# Patient Record
Sex: Female | Born: 1985 | Race: White | Hispanic: No | Marital: Married | Smoking: Never smoker
Health system: Southern US, Community
[De-identification: ages and names within clinical notes are randomized; demographics above are authoritative.]

## PROBLEM LIST (undated history)

## (undated) DIAGNOSIS — Z789 Other specified health status: Secondary | ICD-10-CM

## (undated) DIAGNOSIS — T8859XA Other complications of anesthesia, initial encounter: Secondary | ICD-10-CM

## (undated) DIAGNOSIS — L509 Urticaria, unspecified: Secondary | ICD-10-CM

## (undated) DIAGNOSIS — T4145XA Adverse effect of unspecified anesthetic, initial encounter: Secondary | ICD-10-CM

## (undated) HISTORY — DX: Urticaria, unspecified: L50.9

## (undated) HISTORY — PX: OTHER SURGICAL HISTORY: SHX169

---

## 2017-02-17 NOTE — L&D Delivery Note (Signed)
Delivery Note   09/25/2017  Date of delivery: 09/26/17  Lynden Angallie R Gilberti, 32 y.o., @ 6143w6d,  G1P0, who was admitted for PPROM, clear fluids. I was called to the room when she progressed 0 station in the second stage of labor. Pt felt urge to push and was complete. During pushing there were deep variable noted during pushing to the lowest of 70-80 and return to baseline of 150after pushing stopped. When pt has pushed for 2 hours I called Dr Normand Sloopillard and updated her, I was advised to to start an amnioinfusion. Started with a 300ml bolus and continue with 13650ml/hr, variable resolved. Dr Normand Sloopillard was in route to the hospital at this point.  She pushed for another 1hour with Dr Normand Sloopillard present. During delivery after the head and a compound posterior arm was noted, I took 15sec and attempted to delivery the posterior arm, but was unsuccessful with gentle traction, Dr Normand Sloopillard stepped in for me and finished the delivery of the infant.  She delivered a viable infant, cephalic compound presentation with the posterior arm delivered first after the anterior shoulder was seen and restituted to the ROT position over an intact perineum.  A nuchal cord   was not identified, but x 2body cord was. The baby was placed on maternal abdomen while initial step of NRP were perfmored (Dry, Stimulated, and warmed). Hat placed on baby for thermoregulation. Delayed cord clamping was performed for 15 seconds due to the need for NICU evaluation the infant was brought to the radiant warmer.  Cord double clamped and cut.  Cord cut by Father. Apgar scores were 5 and 6 and 9. The placenta which was sent to pathology delivered spontaneously, and active ,managemnt of the third stage was performed with pitocin running, the placenta delivered shultz, with a 3 vessel cord.  Inspection revealed 2nd degree. An examination of the vaginal vault and cervix was free from lacerations. The uterus was firm, bleeding stable.  The repair was done under local.    Placenta and umbilical artery blood gas were sent.  There were no complications during the procedure.  Mom in bed with infant on radiant warmer with dad by her side. Left in stable condition.  Maternal Info: Anesthesia: Epidural Episiotomy: no Lacerations:  2nd degree repaired with 3-0 vucryl Suture Repair: 3-0 vicryl Est. Blood Loss (mL):  200  Newborn Info: Baby Sex: female Circumcision: No Babies Name: Alvino Chapelllen APGAR (1 MIN): 5   APGAR (5 MINS): 6   APGAR (10 MINS): 9     Mom to postpartum.  Baby to Couplet care / Skin to Skin.  Dr Normand Sloopillard present for the delivery.   Jaspreet Hollings, CNM, NP-C 09/26/17 1:55 AM

## 2017-02-27 LAB — OB RESULTS CONSOLE HEPATITIS B SURFACE ANTIGEN: Hepatitis B Surface Ag: NEGATIVE

## 2017-02-27 LAB — OB RESULTS CONSOLE ABO/RH: RH Type: POSITIVE

## 2017-02-27 LAB — OB RESULTS CONSOLE RPR: RPR: NONREACTIVE

## 2017-02-27 LAB — OB RESULTS CONSOLE RUBELLA ANTIBODY, IGM: Rubella: IMMUNE

## 2017-02-27 LAB — OB RESULTS CONSOLE GC/CHLAMYDIA
CHLAMYDIA, DNA PROBE: NEGATIVE
GC PROBE AMP, GENITAL: NEGATIVE

## 2017-02-27 LAB — OB RESULTS CONSOLE HIV ANTIBODY (ROUTINE TESTING): HIV: NONREACTIVE

## 2017-09-25 ENCOUNTER — Inpatient Hospital Stay (HOSPITAL_COMMUNITY)
Admission: AD | Admit: 2017-09-25 | Discharge: 2017-09-28 | DRG: 805 | Disposition: A | Discharge status: Home / Self Care | Payer: BLUE CROSS/BLUE SHIELD | Attending: Obstetrics and Gynecology | Admitting: Obstetrics and Gynecology

## 2017-09-25 ENCOUNTER — Inpatient Hospital Stay (HOSPITAL_COMMUNITY): Payer: BLUE CROSS/BLUE SHIELD | Admitting: Anesthesiology

## 2017-09-25 ENCOUNTER — Encounter (HOSPITAL_COMMUNITY): Payer: Self-pay

## 2017-09-25 ENCOUNTER — Inpatient Hospital Stay (HOSPITAL_BASED_OUTPATIENT_CLINIC_OR_DEPARTMENT_OTHER): Payer: BLUE CROSS/BLUE SHIELD

## 2017-09-25 ENCOUNTER — Other Ambulatory Visit: Payer: Self-pay

## 2017-09-25 DIAGNOSIS — O9912 Other diseases of the blood and blood-forming organs and certain disorders involving the immune mechanism complicating childbirth: Secondary | ICD-10-CM | POA: Diagnosis present

## 2017-09-25 DIAGNOSIS — D6959 Other secondary thrombocytopenia: Secondary | ICD-10-CM | POA: Diagnosis present

## 2017-09-25 DIAGNOSIS — O99824 Streptococcus B carrier state complicating childbirth: Secondary | ICD-10-CM | POA: Diagnosis present

## 2017-09-25 DIAGNOSIS — E559 Vitamin D deficiency, unspecified: Secondary | ICD-10-CM | POA: Diagnosis present

## 2017-09-25 DIAGNOSIS — O4290 Premature rupture of membranes, unspecified as to length of time between rupture and onset of labor, unspecified weeks of gestation: Secondary | ICD-10-CM

## 2017-09-25 DIAGNOSIS — Z3A36 36 weeks gestation of pregnancy: Secondary | ICD-10-CM

## 2017-09-25 DIAGNOSIS — O42913 Preterm premature rupture of membranes, unspecified as to length of time between rupture and onset of labor, third trimester: Principal | ICD-10-CM | POA: Diagnosis present

## 2017-09-25 DIAGNOSIS — O326XX0 Maternal care for compound presentation, not applicable or unspecified: Secondary | ICD-10-CM | POA: Diagnosis present

## 2017-09-25 DIAGNOSIS — D6949 Other primary thrombocytopenia: Secondary | ICD-10-CM | POA: Diagnosis present

## 2017-09-25 DIAGNOSIS — O42919 Preterm premature rupture of membranes, unspecified as to length of time between rupture and onset of labor, unspecified trimester: Secondary | ICD-10-CM | POA: Diagnosis present

## 2017-09-25 HISTORY — DX: Adverse effect of unspecified anesthetic, initial encounter: T41.45XA

## 2017-09-25 HISTORY — DX: Other specified health status: Z78.9

## 2017-09-25 HISTORY — DX: Other complications of anesthesia, initial encounter: T88.59XA

## 2017-09-25 LAB — CBC
HEMATOCRIT: 36.9 % (ref 36.0–46.0)
Hemoglobin: 12.2 g/dL (ref 12.0–15.0)
MCH: 31.3 pg (ref 26.0–34.0)
MCHC: 33.1 g/dL (ref 30.0–36.0)
MCV: 94.6 fL (ref 78.0–100.0)
PLATELETS: 121 10*3/uL — AB (ref 150–400)
RBC: 3.9 MIL/uL (ref 3.87–5.11)
RDW: 16.1 % — AB (ref 11.5–15.5)
WBC: 10.1 10*3/uL (ref 4.0–10.5)

## 2017-09-25 LAB — TYPE AND SCREEN
ABO/RH(D): O POS
Antibody Screen: NEGATIVE

## 2017-09-25 LAB — ABO/RH: ABO/RH(D): O POS

## 2017-09-25 LAB — POCT FERN TEST: POCT Fern Test: POSITIVE

## 2017-09-25 LAB — GROUP B STREP BY PCR: Group B strep by PCR: POSITIVE — AB

## 2017-09-25 LAB — RPR: RPR Ser Ql: NONREACTIVE

## 2017-09-25 MED ORDER — ACETAMINOPHEN 325 MG PO TABS: 650.0000 mg | ORAL_TABLET | ORAL | Status: DC | PRN

## 2017-09-25 MED ORDER — LACTATED RINGERS AMNIOINFUSION
INTRAVENOUS | Status: DC
  Administered 2017-09-25: via INTRAUTERINE
  Filled 2017-09-25 (×2): qty 1000

## 2017-09-25 MED ORDER — OXYTOCIN BOLUS FROM INFUSION
500.0000 mL | Freq: Once | INTRAVENOUS | Status: AC
  Administered 2017-09-26: 500 mL via INTRAVENOUS

## 2017-09-25 MED ORDER — PHENYLEPHRINE 40 MCG/ML (10ML) SYRINGE FOR IV PUSH (FOR BLOOD PRESSURE SUPPORT)
80.0000 ug | PREFILLED_SYRINGE | INTRAVENOUS | Status: DC | PRN
  Filled 2017-09-25: qty 5

## 2017-09-25 MED ORDER — EPHEDRINE 5 MG/ML INJ: 10.0000 mg | INTRAVENOUS | Status: DC | PRN

## 2017-09-25 MED ORDER — PENICILLIN G POT IN DEXTROSE 60000 UNIT/ML IV SOLN
3.0000 10*6.[IU] | INTRAVENOUS | Status: DC
  Filled 2017-09-25 (×2): qty 50

## 2017-09-25 MED ORDER — LIDOCAINE HCL (PF) 1 % IJ SOLN
INTRAMUSCULAR | Status: DC | PRN
  Administered 2017-09-25: 5 mL via EPIDURAL

## 2017-09-25 MED ORDER — OXYCODONE-ACETAMINOPHEN 5-325 MG PO TABS: 1.0000 | ORAL_TABLET | ORAL | Status: DC | PRN

## 2017-09-25 MED ORDER — LACTATED RINGERS IV SOLN
INTRAVENOUS | Status: DC
  Administered 2017-09-25 (×2): via INTRAVENOUS

## 2017-09-25 MED ORDER — SODIUM CHLORIDE 0.9 % IV SOLN
5.0000 10*6.[IU] | Freq: Once | INTRAVENOUS | Status: AC
  Administered 2017-09-25: 5 10*6.[IU] via INTRAVENOUS
  Filled 2017-09-25: qty 5

## 2017-09-25 MED ORDER — LACTATED RINGERS IV SOLN
500.0000 mL | Freq: Once | INTRAVENOUS | Status: AC
  Administered 2017-09-25: 500 mL via INTRAVENOUS

## 2017-09-25 MED ORDER — OXYTOCIN 40 UNITS IN LACTATED RINGERS INFUSION - SIMPLE MED
2.5000 [IU]/h | INTRAVENOUS | Status: DC
  Filled 2017-09-25: qty 1000

## 2017-09-25 MED ORDER — EPHEDRINE 5 MG/ML INJ
10.0000 mg | INTRAVENOUS | Status: DC | PRN
  Filled 2017-09-25: qty 2

## 2017-09-25 MED ORDER — SOD CITRATE-CITRIC ACID 500-334 MG/5ML PO SOLN: 30.0000 mL | ORAL | Status: DC | PRN

## 2017-09-25 MED ORDER — LIDOCAINE HCL (PF) 1 % IJ SOLN
30.0000 mL | INTRAMUSCULAR | Status: DC | PRN
  Administered 2017-09-26: 30 mL via SUBCUTANEOUS
  Filled 2017-09-25: qty 30

## 2017-09-25 MED ORDER — OXYCODONE-ACETAMINOPHEN 5-325 MG PO TABS: 2.0000 | ORAL_TABLET | ORAL | Status: DC | PRN

## 2017-09-25 MED ORDER — PHENYLEPHRINE 40 MCG/ML (10ML) SYRINGE FOR IV PUSH (FOR BLOOD PRESSURE SUPPORT)
80.0000 ug | PREFILLED_SYRINGE | INTRAVENOUS | Status: DC | PRN
  Filled 2017-09-25: qty 5
  Filled 2017-09-25: qty 10

## 2017-09-25 MED ORDER — FENTANYL 2.5 MCG/ML BUPIVACAINE 1/10 % EPIDURAL INFUSION (WH - ANES)
14.0000 mL/h | INTRAMUSCULAR | Status: DC | PRN
  Administered 2017-09-25 (×3): 14 mL/h via EPIDURAL
  Filled 2017-09-25 (×3): qty 100

## 2017-09-25 MED ORDER — PHENYLEPHRINE 40 MCG/ML (10ML) SYRINGE FOR IV PUSH (FOR BLOOD PRESSURE SUPPORT): 80.0000 ug | PREFILLED_SYRINGE | INTRAVENOUS | Status: DC | PRN

## 2017-09-25 MED ORDER — TERBUTALINE SULFATE 1 MG/ML IJ SOLN
0.2500 mg | Freq: Once | INTRAMUSCULAR | Status: DC | PRN
  Filled 2017-09-25: qty 1

## 2017-09-25 MED ORDER — LACTATED RINGERS IV SOLN: 500.0000 mL | Freq: Once | INTRAVENOUS | Status: DC

## 2017-09-25 MED ORDER — DIPHENHYDRAMINE HCL 50 MG/ML IJ SOLN: 12.5000 mg | INTRAMUSCULAR | Status: DC | PRN

## 2017-09-25 MED ORDER — ONDANSETRON HCL 4 MG/2ML IJ SOLN: 4.0000 mg | Freq: Four times a day (QID) | INTRAMUSCULAR | Status: DC | PRN

## 2017-09-25 MED ORDER — LACTATED RINGERS IV SOLN: 500.0000 mL | INTRAVENOUS | Status: DC | PRN

## 2017-09-25 MED ORDER — OXYTOCIN 40 UNITS IN LACTATED RINGERS INFUSION - SIMPLE MED
1.0000 m[IU]/min | INTRAVENOUS | Status: DC
  Administered 2017-09-25: 2 m[IU]/min via INTRAVENOUS

## 2017-09-25 MED ORDER — PENICILLIN G 3 MILLION UNITS IVPB - SIMPLE MED
3.0000 10*6.[IU] | INTRAVENOUS | Status: DC
  Administered 2017-09-25 (×3): 3 10*6.[IU] via INTRAVENOUS
  Filled 2017-09-25: qty 3
  Filled 2017-09-25 (×2): qty 100
  Filled 2017-09-25 (×2): qty 3
  Filled 2017-09-25 (×2): qty 100

## 2017-09-25 NOTE — Anesthesia Preprocedure Evaluation (Signed)
Anesthesia Evaluation  Patient identified by MRN, date of birth, ID band Patient awake    Reviewed: Allergy & Precautions, Patient's Chart, lab work & pertinent test results  Airway Mallampati: II       Dental  (+) Teeth Intact   Pulmonary    Pulmonary exam normal        Cardiovascular Normal cardiovascular exam     Neuro/Psych    GI/Hepatic Neg liver ROS,   Endo/Other  negative endocrine ROS  Renal/GU negative Renal ROS     Musculoskeletal   Abdominal   Peds  Hematology   Anesthesia Other Findings   Reproductive/Obstetrics (+) Pregnancy                             Anesthesia Physical Anesthesia Plan  ASA: II  Anesthesia Plan: Epidural   Post-op Pain Management:    Induction:   PONV Risk Score and Plan:   Airway Management Planned: Natural Airway  Additional Equipment:   Intra-op Plan:   Post-operative Plan:   Informed Consent: I have reviewed the patients History and Physical, chart, labs and discussed the procedure including the risks, benefits and alternatives for the proposed anesthesia with the patient or authorized representative who has indicated his/her understanding and acceptance.     Plan Discussed with:   Anesthesia Plan Comments: (Lab Results      Component                Value               Date                      WBC                      10.1                09/25/2017                HGB                      12.2                09/25/2017                HCT                      36.9                09/25/2017                MCV                      94.6                09/25/2017                PLT                      121 (L)             09/25/2017           )        Anesthesia Quick Evaluation

## 2017-09-25 NOTE — Anesthesia Procedure Notes (Signed)
Epidural Patient location during procedure: OB Start time: 09/25/2017 10:24 AM End time: 09/25/2017 10:33 AM  Staffing Anesthesiologist: Shelton SilvasHollis, Kevin D, MD Performed: anesthesiologist   Preanesthetic Checklist Completed: patient identified, site marked, surgical consent, pre-op evaluation, timeout performed, IV checked, risks and benefits discussed and monitors and equipment checked  Epidural Patient position: sitting Prep: ChloraPrep Patient monitoring: heart rate, continuous pulse ox and blood pressure Approach: midline Location: L3-L4 Injection technique: LOR saline  Needle:  Needle type: Tuohy  Needle gauge: 17 G Needle length: 9 cm Catheter type: closed end flexible Catheter size: 20 Guage Test dose: negative and 1.5% lidocaine  Assessment Events: blood not aspirated, injection not painful, no injection resistance and no paresthesia  Additional Notes Difficult w/  patient positioning.   LOR @ 4.5   Patient identified. Risks/Benefits/Options discussed with patient including but not limited to bleeding, infection, nerve damage, paralysis, failed block, incomplete pain control, headache, blood pressure changes, nausea, vomiting, reactions to medications, itching and postpartum back pain. Confirmed with bedside nurse the patient's most recent platelet count. Confirmed with patient that they are not currently taking any anticoagulation, have any bleeding history or any family history of bleeding disorders. Patient expressed understanding and wished to proceed. All questions were answered. Sterile technique was used throughout the entire procedure. Please see nursing notes for vital signs. Test dose was given through epidural catheter and negative prior to continuing to dose epidural or start infusion. Warning signs of high block given to the patient including shortness of breath, tingling/numbness in hands, complete motor block, or any concerning symptoms with instructions to call  for help. Patient was given instructions on fall risk and not to get out of bed. All questions and concerns addressed with instructions to call with any issues or inadequate analgesia.    Reason for block:procedure for pain

## 2017-09-25 NOTE — MAU Note (Signed)
Pt presents to MAU c/o SROM @0345  clear fluid with bloody show. Pt reports +FM.

## 2017-09-25 NOTE — Anesthesia Pain Management Evaluation Note (Signed)
  CRNA Pain Management Visit Note  Patient: Valerie Huffman, 32 y.o., female  "Hello I am a [facility] of the [facility]. We have an anesthesia team available at all times to provide care throughout the hospital, including epidural management and anesthesia for C-section. I don't know your plan for the delivery whether it a natural birth, water birth, IV sedation, nitrous supplementation, doula or epidural, but we want to meet your pain goals."   1.Was your pain managed to your expectations on prior hospitalizations?   No prior hospitalizations  2.What is your expectation for pain management during this hospitalization?     Epidural  3.How can we help you reach that goal? Epidural when ready;being evaluated now by NP;pt 9235w5d  Record the patient's initial score and the patient's pain goal.   Pain: 3  Pain Goal: 7 [facility] wants you to be able to say your pain was always managed very well.  Edison PaceWILKERSON,Lorie Melichar 09/25/2017

## 2017-09-25 NOTE — Progress Notes (Signed)
Labor Progress Note  Subjective: Valerie Huffman, 32 y.o., G1P0, with an IUP @ 5665w5d, presented for PPROM, clear fluids @ 0200 on 09/25/2017 with irregular cxt.  Pt resting in bed with husband at bedside in NAD, pt was feeling pressure in bottom with contractions only, pt comfortable in NAD. RN: Reported pt having cxt occurring every min with variable with cxt. RN performed position change, with decreasing pitocin from 10 to 6.  Patient Active Problem List   Diagnosis Date Noted  . Preterm premature rupture of membranes (PPROM) with unknown onset of labor 09/25/2017    Objective: BP 119/65   Pulse (!) 107   Temp 98 F (36.7 C)   Resp 18   Ht 5\' 4"  (1.626 m)   Wt 82.1 kg   SpO2 99%   BMI 31.07 kg/m  No intake/output data recorded. No intake/output data recorded. NST: FHR baseline 140 bpm, Variability: moderate, Accelerations:not present, Decelerations: present reoccurring variables = Cat 2 CTX:  regular, every 2-3 minutes, lasting 60 seconds, mild to palpate.  Uterus gravid, soft non tender, mild to palpate with contractions.  SVE:  Dilation: 7 Effacement (%): 100 Station: 0 Exam by:: sowder Pitocin at start at 6 mUn/min  Assessment:  Valerie Huffman, 32 y.o., G1P0, with an IUP @ 8765w5d, presented for PPROM, clear. Pt in stable condition with epidural.  Patient Active Problem List   Diagnosis Date Noted  . Preterm premature rupture of membranes (PPROM) with unknown onset of labor 09/25/2017   NICHD: Category 2 ( reoccurring variables, utilizing maternal position change with decreasing the pitocin)   Membranes:  SROM x16hrs, no s/s of infection  Induction:    Pitocin - start at 6  Pain management:               Epidural placement:  on 08/09 @ 1015  GBS Positive  Abx: Penicillin x 2 doses.  Plan: Continue labor plan Continuous monitoring Rest/Frequent position changes to facilitate fetal rotation and descent. Will reassess with cervical exam when fetal strip or pt  indicates, will decrease cervical exam to decrease bacterial transmission.  Continue pitocin at 6 and decrease if fetal heart  Strip indicates it.  per protocol If variables continues will start amnio-infusion with 300mls bolus and 17350mh/hr.  Anticipate labor progression and vaginal delivery.   Md Dillard updated.  Dale DurhamJade Dvid Pendry, NP-C, CNM, MSN 09/25/2017. 6:05 PM

## 2017-09-25 NOTE — H&P (Signed)
Lynden AngCallie R Dortch is a 32 y.o. female presenting for preterm premature rupture of membranes. OB History    Gravida  1   Para      Term      Preterm      AB      Living        SAB      TAB      Ectopic      Multiple      Live Births             Past Medical History:  Diagnosis Date  . Complication of anesthesia    pulse 140 during surgery   . Medical history non-contributory    Past Surgical History:  Procedure Laterality Date  . ankle     Family History: family history includes Diabetes in her father. Social History:  reports that she has never smoked. She has never used smokeless tobacco. She reports that she does not drink alcohol or use drugs.     Maternal Diabetes: No Genetic Screening: Normal Maternal Ultrasounds/Referrals: Normal Fetal Ultrasounds or other Referrals:  None Maternal Substance Abuse:  No Significant Maternal Medications:  None Significant Maternal Lab Results:  Lab values include: Other:  Thrombocytopenia, platelets 121 today Other Comments:  None  Review of Systems  Gastrointestinal: Negative for abdominal pain.  All other systems reviewed and are negative.  Maternal Medical History:  Reason for admission: Rupture of membranes.   Contractions: Onset was 3-5 hours ago.   Frequency: rare.    Fetal activity: Perceived fetal activity is normal.   Last perceived fetal movement was within the past hour.    Prenatal complications: Preterm labor.   Prenatal Complications - Diabetes: none.    Dilation: 1.5 Effacement (%): 50 Station: -2 Exam by:: Kathalene FramesEllis Kiari Hosmer CNM   Vitals:   09/25/17 0512  BP: 121/78  Pulse: (!) 123  Resp: 17  Temp: 99 F (37.2 C)  TempSrc: Oral   Results for orders placed or performed during the hospital encounter of 09/25/17 (from the past 24 hour(s))  Fern Test     Status: Abnormal   Collection Time: 09/25/17  5:30 AM  Result Value Ref Range   POCT Fern Test Positive = ruptured amniotic membanes   CBC      Status: Abnormal   Collection Time: 09/25/17  6:00 AM  Result Value Ref Range   WBC 10.1 4.0 - 10.5 K/uL   RBC 3.90 3.87 - 5.11 MIL/uL   Hemoglobin 12.2 12.0 - 15.0 g/dL   HCT 14.736.9 82.936.0 - 56.246.0 %   MCV 94.6 78.0 - 100.0 fL   MCH 31.3 26.0 - 34.0 pg   MCHC 33.1 30.0 - 36.0 g/dL   RDW 13.016.1 (H) 86.511.5 - 78.415.5 %   Platelets 121 (L) 150 - 400 K/uL    Maternal Exam:  Abdomen: Patient reports no abdominal tenderness. Fundal height is Size > dates.   Estimated fetal weight is 7lbs 7 oz .    Introitus: Normal vulva. Normal vagina.  Ferning test: positive.  Amniotic fluid character: clear.  Pelvis: adequate for delivery.   Cervix: Cervix evaluated by digital exam.     Fetal Exam Fetal Monitor Review: Mode: ultrasound.   Baseline rate: 145.  Variability: moderate (6-25 bpm).   Pattern: no decelerations and accelerations present.    Fetal State Assessment: Category I - tracings are normal.     Physical Exam  Nursing note and vitals reviewed. Constitutional: She is oriented to  person, place, and time. She appears well-developed and well-nourished.  HENT:  Head: Normocephalic.  Eyes: Pupils are equal, round, and reactive to light.  Cardiovascular: Regular rhythm and normal heart sounds.  Mild tachycardia of 115 BPM when I assessed patient   Respiratory: Breath sounds normal.  GI: There is no tenderness.  Genitourinary: Vagina normal and uterus normal.  Musculoskeletal: Normal range of motion.  Neurological: She is alert and oriented to person, place, and time.  Skin: Skin is warm and dry.  Psychiatric: She has a normal mood and affect. Her behavior is normal. Judgment and thought content normal.    Prenatal labs: ABO, Rh:  O+ Antibody:   Negative RPR:   NR HBsAg:   NR HIV:   NR GBS:   Pending   Assessment/Plan: 32 y.o. G1P0 at [redacted]w[redacted]d Preterm premature rupture of membranes Category 1 FHTs Admit to L&D Ultrasound to confirm vertex GBS collected, results pending   Patient desires epidural for pain management in labor  Anticipate NSVD  Janeece Riggers 09/25/2017, 6:10 AM

## 2017-09-25 NOTE — Progress Notes (Signed)
Labor Progress Note  Subjective: Valerie Huffman, 32 y.o., G1P0, with an IUP @ 712w5d, presented for PPROM, clear fluids @ 0200 on 09/25/2017 with irregular cxt.  Pt resting in bed with husband at bedside in NAD, pt was feeling in creased pain with cxt and request the epidural, s/p epidural pt comfortable in NAD.  Patient Active Problem List   Diagnosis Date Noted  . Preterm premature rupture of membranes (PPROM) with unknown onset of labor 09/25/2017    Objective: BP 118/64   Pulse (!) 103   Temp 98.1 F (36.7 C)   Resp 18   Ht 5\' 4"  (1.626 m)   Wt 82.1 kg   SpO2 99%   BMI 31.07 kg/m  No intake/output data recorded. No intake/output data recorded. NST: FHR baseline 140 bpm, Variability: moderate, Accelerations:present, Decelerations:  Absent= Cat 1/Reactive CTX:  regular, every 2-3 minutes, lasting 60 seconds, mild to palpate.  Uterus gravid, soft non tender, mild to palpate with contractions.  SVE:  Dilation: 3 Effacement (%): 90 Station: -2 Exam by:: Valerie DurhamJade Jasmaine Rochel NP Pitocin at start at 8 mUn/min  Assessment:  Valerie Huffman, 32 y.o., G1P0, with an IUP @ 7412w5d, presented for PPROM, clear. Pt in stable condition with epidural, in no pain.  Patient Active Problem List   Diagnosis Date Noted  . Preterm premature rupture of membranes (PPROM) with unknown onset of labor 09/25/2017   NICHD: Category 1  Membranes:  SROM x12hrs, no s/s of infection  Induction:    Pitocin - start at 8  Pain management:               Epidural placement:  on 08/09 @ 1015  GBS Positive  Abx: Penicillin x 2 doses.  Plan: Continue labor plan Continuous monitoring Rest/Frequent position changes to facilitate fetal rotation and descent. Will reassess with cervical exam when fetal strip or pt indicates, will decrease cervical exam to decrease bacterial transmission.  Continue pitocin 2X2 per protocol Anticipate labor progression and vaginal delivery.   Md Su HiltRoberts to be updated PRN.  Valerie DurhamJade  Graelyn Bihl, NP-C, CNM, MSN 09/25/2017. 2:26 PM

## 2017-09-25 NOTE — Progress Notes (Signed)
Labor Progress Note  Subjective: Valerie Huffman, 32 y.o., G1P0, with an IUP @ 5727w5d, presented for PPROM, clear fluids @ 0200 on 09/25/2017 with irregular cxt. Pt resting in bed with husband at bedside in NAD, pt feeling mild cxt, but tolerated them well, pt wants epidural when cxt increase.   I discussed with patient risks, benefits and alternatives of labor aumention including higher risk of cesarean delivery compared to spontaneous labor.  We discussed risks of induction agents including effects on fetal heart beat, contraction pattern and need for close monitoring.  Patient expressed understanding of all this and desired to proceed with the aumention. Risks and benefits of induction were reviewed, including failure of method, prolonged labor, need for further intervention, risk of cesarean.  Patient and family verbalized understanding and denies any further questions at this time. Pt and family wish to proceed with aumention process. Discussed induction options of Cytotec, foley bulb, AROM, and pitocin were reviewed as well as risks and benefits with use of each discussed. Patient Active Problem List   Diagnosis Date Noted  . Preterm premature rupture of membranes (PPROM) with unknown onset of labor 09/25/2017    Objective: BP 126/85   Pulse (!) 107   Temp 98.1 F (36.7 C) (Oral)   Resp 18   Ht 5\' 4"  (1.626 m)   Wt 82.1 kg   BMI 31.07 kg/m  No intake/output data recorded. No intake/output data recorded. NST: FHR baseline 140 bpm, Variability: moderate, Accelerations:present, Decelerations:  Absent= Cat 1/Reactive CTX:  irregular, every 2-5 minutes, lasting 60-80 seconds, mild to palpate.  Uterus gravid, soft non tender, mild to palpate with contractions.  SVE:  Dilation: 1.5 Effacement (%): 50 Station: -2 Exam by:: Ellison Rieth,CNM Pitocin at start at 2 mUn/min  Assessment:  Valerie Huffman, 32 y.o., G1P0, with an IUP @ 5227w5d, presented for PPROM, clear. Patient Active Problem List   Diagnosis Date Noted  . Preterm premature rupture of membranes (PPROM) with unknown onset of labor 09/25/2017   NICHD: Category 1  Membranes:  SROM x 7hrs, no s/s of infection  Induction:    Pitocin - start at 2  Pain management:               Epidural placement:  PRN  GBS Positive  Abx: Penicillin.  Plan: Continue labor plan Continuous monitoring Rest Ambulate Frequent position changes to facilitate fetal rotation and descent. Will reassess with cervical exam when fetal strip or pt indicates, will decrease cervical exam to decrease bacterial transmission.  Start pitocin 2X2 per protocol Anticipate labor progression and vaginal delivery.   Md Su HiltRoberts to be updated PRN.  Dale DurhamJade Coraline Talwar, NP-C, CNM, MSN 09/25/2017. 9:26 AM

## 2017-09-26 ENCOUNTER — Encounter (HOSPITAL_COMMUNITY): Payer: Self-pay

## 2017-09-26 LAB — CBC
HCT: 37.1 % (ref 36.0–46.0)
HEMOGLOBIN: 12.7 g/dL (ref 12.0–15.0)
MCH: 32.2 pg (ref 26.0–34.0)
MCHC: 34.2 g/dL (ref 30.0–36.0)
MCV: 93.9 fL (ref 78.0–100.0)
PLATELETS: 141 10*3/uL — AB (ref 150–400)
RBC: 3.95 MIL/uL (ref 3.87–5.11)
RDW: 16.2 % — ABNORMAL HIGH (ref 11.5–15.5)
WBC: 26.2 10*3/uL — ABNORMAL HIGH (ref 4.0–10.5)

## 2017-09-26 MED ORDER — PRENATAL MULTIVITAMIN CH
1.0000 | ORAL_TABLET | Freq: Every day | ORAL | Status: DC
  Administered 2017-09-26 – 2017-09-28 (×3): 1 via ORAL
  Filled 2017-09-26 (×3): qty 1

## 2017-09-26 MED ORDER — DIPHENHYDRAMINE HCL 25 MG PO CAPS: 25.0000 mg | ORAL_CAPSULE | Freq: Four times a day (QID) | ORAL | Status: DC | PRN

## 2017-09-26 MED ORDER — LORATADINE 5 MG/5ML PO SYRP: 5.0000 mg | ORAL_SOLUTION | Freq: Every day | ORAL | Status: DC

## 2017-09-26 MED ORDER — IBUPROFEN 600 MG PO TABS
600.0000 mg | ORAL_TABLET | Freq: Four times a day (QID) | ORAL | Status: DC
Start: 2017-09-26 — End: 2017-09-28
  Administered 2017-09-26 – 2017-09-28 (×10): 600 mg via ORAL
  Filled 2017-09-26 (×10): qty 1

## 2017-09-26 MED ORDER — WITCH HAZEL-GLYCERIN EX PADS: 1.0000 "application " | MEDICATED_PAD | CUTANEOUS | Status: DC | PRN

## 2017-09-26 MED ORDER — BENZOCAINE-MENTHOL 20-0.5 % EX AERO
1.0000 "application " | INHALATION_SPRAY | CUTANEOUS | Status: DC | PRN
  Administered 2017-09-26: 1 via TOPICAL
  Filled 2017-09-26: qty 56

## 2017-09-26 MED ORDER — SENNOSIDES-DOCUSATE SODIUM 8.6-50 MG PO TABS
2.0000 | ORAL_TABLET | ORAL | Status: DC
  Administered 2017-09-28: 2 via ORAL
  Filled 2017-09-26 (×2): qty 2

## 2017-09-26 MED ORDER — DIBUCAINE 1 % RE OINT: 1.0000 "application " | TOPICAL_OINTMENT | RECTAL | Status: DC | PRN

## 2017-09-26 MED ORDER — ONDANSETRON HCL 4 MG/2ML IJ SOLN: 4.0000 mg | INTRAMUSCULAR | Status: DC | PRN

## 2017-09-26 MED ORDER — TETANUS-DIPHTH-ACELL PERTUSSIS 5-2.5-18.5 LF-MCG/0.5 IM SUSP: 0.5000 mL | Freq: Once | INTRAMUSCULAR | Status: DC

## 2017-09-26 MED ORDER — ZOLPIDEM TARTRATE 5 MG PO TABS: 5.0000 mg | ORAL_TABLET | Freq: Every evening | ORAL | Status: DC | PRN

## 2017-09-26 MED ORDER — ONDANSETRON HCL 4 MG PO TABS: 4.0000 mg | ORAL_TABLET | ORAL | Status: DC | PRN

## 2017-09-26 MED ORDER — SIMETHICONE 80 MG PO CHEW: 80.0000 mg | CHEWABLE_TABLET | ORAL | Status: DC | PRN

## 2017-09-26 MED ORDER — ACETAMINOPHEN 325 MG PO TABS: 650.0000 mg | ORAL_TABLET | ORAL | Status: DC | PRN

## 2017-09-26 MED ORDER — COCONUT OIL OIL: 1.0000 "application " | TOPICAL_OIL | Status: DC | PRN

## 2017-09-26 MED ORDER — LORATADINE 10 MG PO TABS
10.0000 mg | ORAL_TABLET | Freq: Every day | ORAL | Status: DC
  Administered 2017-09-26 – 2017-09-27 (×2): 10 mg via ORAL
  Filled 2017-09-26 (×3): qty 1

## 2017-09-26 NOTE — Anesthesia Postprocedure Evaluation (Signed)
Anesthesia Post Note  Patient: Valerie Huffman  Procedure(s) Performed: AN AD HOC LABOR EPIDURAL     Anesthesia Type: Epidural    Last Vitals:  Vitals:   09/26/17 0430 09/26/17 0826  BP: 127/73 111/61  Pulse: (!) 111 (!) 110  Resp: 18 18  Temp: 36.9 C 36.9 C  SpO2:  99%    Last Pain:  Vitals:   09/26/17 0828  TempSrc:   PainSc: 0-No pain   Pain Goal: Patients Stated Pain Goal: 2 (09/26/17 [id number])               Cephus ShellingBURGER,Khadijatou Borak

## 2017-09-26 NOTE — Progress Notes (Signed)
Mother complains of only being able to void small amounts and bladder feels full. Bladder scanned for >999. Patient straight cathed using sterile technique, pt tolerated well.

## 2017-09-26 NOTE — Progress Notes (Addendum)
Labor Progress Note  Subjective: Valerie Huffman, 32 y.o., G1P0, with an IUP @ 3276w5d, presented for PPROM, clear fluids @ 0200 on 09/25/2017 with irregular cxt.  Pt resting in bed with husband at bedside in NAD, pt was feeling pressure in bottom with contractions only, pt comfortable in NAD.  Then was called by RN and informed that pt was feeling lots more pressure.  Patient Active Problem List   Diagnosis Date Noted  . Preterm premature rupture of membranes (PPROM) with unknown onset of labor 09/25/2017    Objective: BP 115/70   Pulse (!) 114   Temp 99.4 F (37.4 C) (Oral)   Resp 16   Ht 5\' 4"  (1.626 m)   Wt 82.1 kg   SpO2 99%   BMI 31.07 kg/m  No intake/output data recorded. Total I/O In: -  Out: 700 [Urine:500; Blood:200] NST: FHR baseline 155 bpm, Variability: moderate, Accelerations:present, Decelerations: x2 variables. = Cat 1 CTX:  regular, every 2-3 minutes, lasting 60 seconds, moderate to palpate.  Uterus gravid, soft non tender, mild to palpate with contractions.  SVE:  Dilation: 8 Effacement (%): 90 Station: -1 Exam by:: Eaton CorporationJade Nakeesha Bowler, CNM Pitocin at start at 6 mUn/min  Assessment:  Valerie Huffman, 32 y.o., G1P0, with an IUP @ 6576w5d, presented for PPROM, clear. Pt in stable condition with epidural.  Patient Active Problem List   Diagnosis Date Noted  . Preterm premature rupture of membranes (PPROM) with unknown onset of labor 09/25/2017   NICHD: Category 1  Membranes:  SROM x19hrs, no s/s of infection (Temp 99.2)  Induction:    Pitocin - start at 6  Pain management:               Epidural placement:  on 08/09 @ 1015  GBS Positive  Abx: Penicillin x 3 doses.  Plan: Continue labor plan Continuous monitoring Rest/Frequent position changes to facilitate fetal rotation and descent. Will reassess with cervical exam when fetal strip or pt indicates, will decrease cervical exam to decrease bacterial transmission.  Continue pitocin at 6 and decrease if fetal  heart  Strip indicates it.  per protocol If variables continues will start amnio-infusion with 300mls bolus and 18950mh/hr.  Anticipate labor progression and vaginal delivery.   Md Normand SloopDillard will be updated PRN.  Valerie DurhamJade Jermone Geister, NP-C, CNM, MSN 09/25/2017. 9:00PM

## 2017-09-26 NOTE — Lactation Note (Signed)
This note was copied from a baby's chart. Lactation Consultation Note  Patient Name: Valerie Huffman AVWUJ'WToday's Date: 09/26/2017 Reason for consult: Follow-up assessment;Other (Comment)(referral by RN)  Had attempted to see earlier to assist with breastfeeding.  Visitors in room. Infant cuing and crying on arrival. Mom reports infant has really not latched and mom has flat nipples at this time. Assist with 20 mm nipple shield.Reviewed how to apply nipple shield and encouraged mom to hand express before applying nipple shield.  Infant pulling and tugging at nipple shield and getting fussy at breast and blood sugar was 44 at last check per parents so gave 5 ml of 20 calorie similac in curved tip syringe in nipple shield during the breastfeeding. Infant Breastfed well with supplement for approximately 10 mintes and pulled of satiated. Gave a few swallows with stimulation on latch score.  But once supplement was added sucking and swallowing increased. Reviewed positioning at breast. Discussed chin in first, touching breasts, cheeks touching breasts and hugging her in close. Showed mom how to wait for wide open mouth and hug infant into breast. Nipple everted slightly and round past breastfeed when nipple shield removed.  Put infant STS with mom and covered them both.  Urged STS and use DEBP with massage and hand expression past Breastfeeding.  Reviewed Preterm infant guidelines again with dad. Urged parents to call as needed.      Maternal Data Formula Feeding for Exclusion: No Does the patient have breastfeeding experience prior to this delivery?: No  Feeding Feeding Type: Breast Fed Length of feed: 10 min  LATCH Score Latch: Grasps breast easily, tongue down, lips flanged, rhythmical sucking.  Audible Swallowing: A few with stimulation  Type of Nipple: Flat  Comfort (Breast/Nipple): Soft / non-tender  Hold (Positioning): Full assist, staff holds infant at breast  LATCH Score:  6  Interventions Interventions: Assisted with latch;Skin to skin;Hand express;Pre-pump if needed  Lactation Tools Discussed/Used Tools: Nipple Shields Nipple shield size: 20 Initiated by:: RN   Consult Status Consult Status: Follow-up Date: 09/27/17 Follow-up type: In-patient    [name] Michaelle CopasS Leora Platt 09/26/2017, 4:25 PM

## 2017-09-26 NOTE — Consult Note (Signed)
Neonatology Note:  Attendance at Code Apgar:   Our team responded to a Code Apgar call to room # 175 following NSVD at 36 6/7 weeks, due to infant with apnea. The requesting physician was Dr. Normand Sloopillard. The mother is a G1P0 O pos, GBS positive with maternal thrombocytopenia (mild). Premature SROM occurred 22 hours PTD and the fluid was clear. Mother was treated with Pen G > 4 hours before delivery and her most recent temperature prior to delivery was 99.4. By report, there had been no fetal distress and no shoulder dystocia. Compound presentation with arm presenting, cord around body. At delivery, the baby was reportedly floppy, pale, dusky, and not breathing, so delayed cord clamping was not done. The OB nursing staff in attendance gave vigorous stimulation and a Code Apgar was called. Our team arrived at 2 minutes of life, at which time the baby was pale, but breathing, HR normal, tone still floppy. There were thick mucous secretions that the baby was unable to clear due to low tone/insufficient vigor of cry, so we bulb suctioned, then performed chest PT and did DeLee suctioning, getting 6 ml clear mucous out. She pinked up with BBO2; O2 saturations normal by 3-4 minutes. Ap 5/6/9 (1-minute Apgar score assigned by OB staff).  We observed the baby until about 14 minutes, noting pink color off supplemental O2, excellent perfusion, improvement in initial tachycardia, normal muscle tone with good movement of both arms, and no palpable fractures of clavicles/humeri. I spoke with the parents in the DR, then transferred the baby to the Pediatrician's care.   Valerie Souhristie C. Diani Jillson, MD

## 2017-09-26 NOTE — Lactation Note (Signed)
This note was copied from a baby's chart. Lactation Consultation Note  Patient Name: Valerie Huffman VWUJW'JToday's Date: 09/26/2017 Reason for consult: Primapara;1st time breastfeeding;Late-preterm 34-36.6wks(LGA) G1P1 vag delivery with epidural. LGA late preterm infant.  Cephalehematoma. Mom has been doing hand expression and spoon feeding back small drops of colostrum.  Mom reports baby Valerie Huffman has not breastfed much and she was unable to get anything when she pumped.  Reviewed caring for your late Preterm Infant with mom.  Mom reports she recently tried to feed her and she did not latch and then she pumped and she did not get anything with pumping. Mom asked if the breast pump was working.  Tubes were not pushed  in on the pump.   Showed mom how to get tubes in to have better suction with the breastpump.  Offered to assist with breastfeeding.  Mom reports she is unable to urinate and wants an ultrasound on her bladder.  Was waiting on RN to let her know.  Mom reprots she is too uncomfortable to try and breastfeed right now,  Will keep doing hand expression and pumping for the next little bit.  Mom asked if would come back later for feeing assistance.  Observed mom hand expressing on left.  Mom able to hand express small drops of colostrum and feed her via spoon.Mom with flat nipples at this time.  Discussed possibly trying nipple shield with Valerie Huffman at next feeding.  Did not review or give info about BFSG.  Mom reports too uncomfortable wants to be seen later.  Maternal Data Does the patient have breastfeeding experience prior to this delivery?: No  Feeding Feeding Type: Breast Milk  LATCH Score                   Interventions    Lactation Tools Discussed/Used Initiated by:: RN   Consult Status Consult Status: Follow-up Date: 09/26/17 Follow-up type: In-patient    Aireonna Bauer Michaelle CopasS Pansy Ostrovsky 09/26/2017, 2:41 PM

## 2017-09-27 LAB — CBC
HCT: 32.7 % — ABNORMAL LOW (ref 36.0–46.0)
HEMOGLOBIN: 11 g/dL — AB (ref 12.0–15.0)
MCH: 32.1 pg (ref 26.0–34.0)
MCHC: 33.6 g/dL (ref 30.0–36.0)
MCV: 95.3 fL (ref 78.0–100.0)
Platelets: 136 10*3/uL — ABNORMAL LOW (ref 150–400)
RBC: 3.43 MIL/uL — AB (ref 3.87–5.11)
RDW: 16.7 % — AB (ref 11.5–15.5)
WBC: 13.1 10*3/uL — AB (ref 4.0–10.5)

## 2017-09-27 NOTE — Lactation Note (Signed)
This note was copied from a baby's chart. Lactation Consultation Note  Patient Name: Valerie Valerie Huffman Reason for consult: Follow-up assessment;Late-preterm 34-36.6wks;Hyperbilirubinemia  P1 mother whose infant is now 6343 hours old.  Baby is receiving phototherapy with another bilirubin to be drawn in the a.m.  Baby sleeping when I arrived and mother in shower.  Information obtained per father.  Reviewed current feeding plan with the father.  He stated that the plan is going well now.  Mother is putting baby to breast with a NS.  She has been pumping using the DEBP and obtaining approximately 10 mls with pumping after feeding.  They are using a #5 Fr feeding tube with mother's milk first for supplementation followed by formula up to policy guidelines.  Provided the guideline sheet for them to keep at bedside and showed father where volume amounts will increase when baby turns 48 hours old.  Father is comfortable with the feeding plan and verbalized understanding.  He stated that mother does not have any soreness to nipples at this time.  Encouraged him to continue keeping time out of phototherapy blanket to a minimum but, if the latch cannot be obtained with the blanket on, mother may remove it for a short time to feed in order to obtain a deep, effective latch.  As soon as breastfeeding is complete place baby immediately back under phototherapy.  Father verbalized understanding.  Asked him to call me if mother has any questions once she finishes her shower.     Maternal Data Formula Feeding for Exclusion: No Has patient been taught Hand Expression?: Yes Does the patient have breastfeeding experience prior to this delivery?: No  Feeding Feeding Type: Bottle Fed - Formula Length of feed: 30 min  LATCH Score                   Interventions    Lactation Tools Discussed/Used Initiated by:: RN   Consult Status Consult Status: Follow-up Date:  09/28/17 Follow-up type: In-patient    Valerie Valerie Huffman Valerie Valerie Huffman Valerie Huffman, 8:20 PM

## 2017-09-27 NOTE — Progress Notes (Signed)
Post Partum Day 1 Subjective: no complaints, up ad lib and tolerating PO  Objective: Blood pressure 120/82, pulse 99, temperature 98.6 F (37 C), temperature source Oral, resp. rate 18, height 5\' 4"  (1.626 m), weight 82.1 kg, SpO2 99 %, unknown if currently breastfeeding.  Physical Exam:  General: alert and cooperative Lochia: appropriate Uterine Fundus: firm Incision: na DVT Evaluation: No evidence of DVT seen on physical exam.  Recent Labs    09/26/17 0258 09/27/17 0658  HGB 12.7 11.0*  HCT 37.1 32.7*    Assessment/Plan: Plan for discharge tomorrow and Breastfeeding   LOS: 2 days   Zymiere Trostle A 09/27/2017, 3:28 PM

## 2017-09-27 NOTE — Lactation Note (Signed)
This note was copied from a baby's chart. Lactation Consultation Note Baby 24 hrs old. Baby 36 6/7 weeks  Mom has flat small nipples using #20 NS. Parents supplementing w/curve tip syring. Gave parents 5 fr. Tubing w/8012ml syring.  Demonstrated set up. FOB pushed slowly. Parents pleased, baby satisfied. Instructed on cleaning.  Baby bili elevated, has been sleepy per FOB. Encouraged to wake q 3hrs if hasn't cued to feed. Instructed to have supplement q 3 hrs, can BF as much as wants. Parents state understanding.  Patient Name: Valerie Huffman WGNFA'OToday's Date: 09/27/2017 Reason for consult: Follow-up assessment   Maternal Data    Feeding Feeding Type: Breast Fed  LATCH Score Latch: Grasps breast easily, tongue down, lips flanged, rhythmical sucking.  Audible Swallowing: A few with stimulation  Type of Nipple: Flat  Comfort (Breast/Nipple): Soft / non-tender  Hold (Positioning): Assistance needed to correctly position infant at breast and maintain latch.  LATCH Score: 7  Interventions Interventions: Breast feeding basics reviewed;Support pillows;Assisted with latch;Position options;Skin to skin;Breast massage;Breast compression;Adjust position  Lactation Tools Discussed/Used Tools: 65F feeding tube / Syringe Nipple shield size: 20   Consult Status Consult Status: Follow-up Date: 09/27/17 Follow-up type: In-patient    Tobe Kervin, Diamond NickelLAURA G 09/27/2017, 2:27 AM

## 2017-09-28 DIAGNOSIS — E559 Vitamin D deficiency, unspecified: Secondary | ICD-10-CM | POA: Diagnosis present

## 2017-09-28 DIAGNOSIS — D6949 Other primary thrombocytopenia: Secondary | ICD-10-CM | POA: Diagnosis present

## 2017-09-28 MED ORDER — IBUPROFEN 600 MG PO TABS: 600.0000 mg | ORAL_TABLET | Freq: Four times a day (QID) | ORAL | 0 refills | Status: AC | PRN

## 2017-09-28 NOTE — Discharge Instructions (Signed)
Postpartum Care After Vaginal Delivery ° °The period of time right after you deliver your newborn is called the postpartum period. °What kind of medical care will I receive? °· You may continue to receive fluids and medicines through an IV tube inserted into one of your veins. °· If an incision was made near your vagina (episiotomy) or if you had some vaginal tearing during delivery, cold compresses may be placed on your episiotomy or your tear. This helps to reduce pain and swelling. °· You may be given a squirt bottle to use when you go to the bathroom. You may use this until you are comfortable wiping as usual. To use the squirt bottle, follow these steps: °? Before you urinate, fill the squirt bottle with warm water. Do not use hot water. °? After you urinate, while you are sitting on the toilet, use the squirt bottle to rinse the area around your urethra and vaginal opening. This rinses away any urine and blood. °? You may do this instead of wiping. As you start healing, you may use the squirt bottle before wiping yourself. Make sure to wipe gently. °? Fill the squirt bottle with clean water every time you use the bathroom. °· You will be given sanitary pads to wear. °How can I expect to feel? °· You may not feel the need to urinate for several hours after delivery. °· You will have some soreness and pain in your abdomen and vagina. °· If you are breastfeeding, you may have uterine contractions every time you breastfeed for up to several weeks postpartum. Uterine contractions help your uterus return to its normal size. °· It is normal to have vaginal bleeding (lochia) after delivery. The amount and appearance of lochia is often similar to a menstrual period in the first week after delivery. It will gradually decrease over the next few weeks to a dry, yellow-brown discharge. For most women, lochia stops completely by 6-8 weeks after delivery. Vaginal bleeding can vary from woman to woman. °· Within the first few  days after delivery, you may have breast engorgement. This is when your breasts feel heavy, full, and uncomfortable. Your breasts may also throb and feel hard, tightly stretched, warm, and tender. After this occurs, you may have milk leaking from your breasts. Your health care provider can help you relieve discomfort due to breast engorgement. Breast engorgement should go away within a few days. °· You may feel more sad or worried than normal due to hormonal changes after delivery. These feelings should not last more than a few days. If these feelings do not go away after several days, speak with your health care provider. °How should I care for myself? °· Tell your health care provider if you have pain or discomfort. °· Drink enough water to keep your urine clear or pale yellow. °· Wash your hands thoroughly with soap and water for at least 20 seconds after changing your sanitary pads, after using the toilet, and before holding or feeding your baby. °· If you are not breastfeeding, avoid touching your breasts a lot. Doing this can make your breasts produce more milk. °· If you become weak or lightheaded, or you feel like you might faint, ask for help before: °? Getting out of bed. °? Showering. °· Change your sanitary pads frequently. Watch for any changes in your flow, such as a sudden increase in volume, a change in color, the passing of large blood clots. If you pass a blood clot from your vagina,   save it to show to your health care provider. Do not flush blood clots down the toilet without having your health care provider look at them. °· Make sure that all your vaccinations are up to date. This can help protect you and your baby from getting certain diseases. You may need to have immunizations done before you leave the hospital. °· If desired, talk with your health care provider about methods of family planning or birth control (contraception). °How can I start bonding with my baby? °Spending as much time as  possible with your baby is very important. During this time, you and your baby can get to know each other and develop a bond. Having your baby stay with you in your room (rooming in) can give you time to get to know your baby. Rooming in can also help you become comfortable caring for your baby. Breastfeeding can also help you bond with your baby. °How can I plan for returning home with my baby? °· Make sure that you have a car seat installed in your vehicle. °? Your car seat should be checked by a certified car seat installer to make sure that it is installed safely. °? Make sure that your baby fits into the car seat safely. °· Ask your health care provider any questions you have about caring for yourself or your baby. Make sure that you are able to contact your health care provider with any questions after leaving the hospital. °This information is not intended to replace advice given to you by your health care provider. Make sure you discuss any questions you have with your health care provider. °Document Released: 12/01/2006 Document Revised: 07/09/2015 Document Reviewed: 01/08/2015 °[employer] Interactive Patient Education © 2018 [employer] ° ° °Postpartum Depression and Baby Blues °The postpartum period begins right after the birth of a baby. During this time, there is often a great amount of joy and excitement. It is also a time of many changes in the life of the parents. Regardless of how many times a mother gives birth, each child brings new challenges and dynamics to the family. It is not unusual to have feelings of excitement along with confusing shifts in moods, emotions, and thoughts. All mothers are at risk of developing postpartum depression or the "baby blues." These mood changes can occur right after giving birth, or they may occur many months after giving birth. The baby blues or postpartum depression can be mild or severe. Additionally, postpartum depression can go away rather quickly, or it can  be a long-term condition. °What are the causes? °Raised hormone levels and the rapid drop in those levels are thought to be a main cause of postpartum depression and the baby blues. A number of hormones change during and after pregnancy. Estrogen and progesterone usually decrease right after the delivery of your baby. The levels of thyroid hormone and various cortisol steroids also rapidly drop. Other factors that play a role in these mood changes include major life events and genetics. °What increases the risk? °If you have any of the following risks for the baby blues or postpartum depression, know what symptoms to watch out for during the postpartum period. Risk factors that may increase the likelihood of getting the baby blues or postpartum depression include: °· Having a personal or family history of depression. °· Having depression while being pregnant. °· Having premenstrual mood issues or mood issues related to oral contraceptives. °· Having a lot of life stress. °· Having marital conflict. °· Lacking   a social support network. °· Having a baby with special needs. °· Having health problems, such as diabetes. ° °What are the signs or symptoms? °Symptoms of baby blues include: °· Brief changes in mood, such as going from extreme happiness to sadness. °· Decreased concentration. °· Difficulty sleeping. °· Crying spells, tearfulness. °· Irritability. °· Anxiety. ° °Symptoms of postpartum depression typically begin within the first month after giving birth. These symptoms include: °· Difficulty sleeping or excessive sleepiness. °· Marked weight loss. °· Agitation. °· Feelings of worthlessness. °· Lack of interest in activity or food. ° °Postpartum psychosis is a very serious condition and can be dangerous. Fortunately, it is rare. Displaying any of the following symptoms is cause for immediate medical attention. Symptoms of postpartum psychosis include: °· Hallucinations and delusions. °· Bizarre or disorganized  behavior. °· Confusion or disorientation. ° °How is this diagnosed? °A diagnosis is made by an evaluation of your symptoms. There are no medical or lab tests that lead to a diagnosis, but there are various questionnaires that a health care provider may use to identify those with the baby blues, postpartum depression, or psychosis. Often, a screening tool called the Edinburgh Postnatal Depression Scale is used to diagnose depression in the postpartum period. °How is this treated? °The baby blues usually goes away on its own in 1-2 weeks. Social support is often all that is needed. You will be encouraged to get adequate sleep and rest. Occasionally, you may be given medicines to help you sleep. °Postpartum depression requires treatment because it can last several months or longer if it is not treated. Treatment may include individual or group therapy, medicine, or both to address any social, physiological, and psychological factors that may play a role in the depression. Regular exercise, a healthy diet, rest, and social support may also be strongly recommended. °Postpartum psychosis is more serious and needs treatment right away. Hospitalization is often needed. °Follow these instructions at home: °· Get as much rest as you can. Nap when the baby sleeps. °· Exercise regularly. Some women find yoga and walking to be beneficial. °· Eat a balanced and nourishing diet. °· Do little things that you enjoy. Have a cup of tea, take a bubble bath, read your favorite magazine, or listen to your favorite music. °· Avoid alcohol. °· Ask for help with household chores, cooking, grocery shopping, or running errands as needed. Do not try to do everything. °· Talk to people close to you about how you are feeling. Get support from your partner, family members, friends, or other new moms. °· Try to stay positive in how you think. Think about the things you are grateful for. °· Do not spend a lot of time alone. °· Only take  over-the-counter or prescription medicine as directed by your health care provider. °· Keep all your postpartum appointments. °· Let your health care provider know if you have any concerns. °Contact a health care provider if: °You are having a reaction to or problems with your medicine. °Get help right away if: °· You have suicidal feelings. °· You think you may harm the baby or someone else. °This information is not intended to replace advice given to you by your health care provider. Make sure you discuss any questions you have with your health care provider. °Document Released: 11/08/2003 Document Revised: 07/12/2015 Document Reviewed: 11/15/2012 °[employer]vier Interactive Patient Education © 2017 [employer] ° ° °

## 2017-09-28 NOTE — Discharge Summary (Signed)
OB Discharge Summary     Patient Name: Valerie Huffman DOB: 11/07/1985 MRN: [id number]  Date of admission: 09/25/2017 Delivering MD: Dale DurhamMONTANA, JADE   Date of discharge: 09/28/2017  Admitting diagnosis: 36.5 WEEKS ROM Intrauterine pregnancy: 2058w6d     Secondary diagnosis:  Active Problems:   Preterm premature rupture of membranes (PPROM) with unknown onset of labor   Vitamin D deficiency   Primary thrombocytopenia (HCC)     Discharge diagnosis: Preterm Pregnancy Delivered                                                                                                Post partum procedures:n/a  Augmentation: Pitocin  Complications: None  Hospital course:  Onset of Labor With Vaginal Delivery     32 y.o. yo G1P0101 at 3958w6d was admitted in Latent Labor on 09/25/2017. Patient had an uncomplicated labor course as follows:  Membrane Rupture Time/Date: 3:45 AM ,09/25/2017   Intrapartum Procedures: Episiotomy: None [1]                                         Lacerations:  2nd degree [3]  Patient had a delivery of a Viable infant. 09/26/2017  Information for the patient's newborn:  Marita KansasDow, Girl Tristyn [[id number]][[id number]]  Delivery Method: Vaginal, Spontaneous(Filed from Delivery Summary)    Pateint had an uncomplicated postpartum course.  She is ambulating, tolerating a regular diet, passing flatus, and urinating well. Patient is discharged home in stable condition on 09/28/17.   Physical exam  Vitals:   09/26/17 2223 09/27/17 0610 09/27/17 1552 09/27/17 2207  BP: 113/71 120/82 132/74 119/86  Pulse: (!) 102 99 (!) 104 (!) 118  Resp: 18 18 18 18   Temp: 98.2 F (36.8 C) 98.6 F (37 C) 98.3 F (36.8 C) 99 F (37.2 C)  TempSrc: Oral Oral Oral Oral  SpO2:      Weight:      Height:       General: alert, cooperative and no distress Lochia: appropriate Uterine Fundus: firm Incision: N/A DVT Evaluation: No evidence of DVT seen on physical exam. Negative Homan's sign. No cords or calf  tenderness. No significant calf/ankle edema. Labs: Lab Results  Component Value Date   WBC 13.1 (H) 09/27/2017   HGB 11.0 (L) 09/27/2017   HCT 32.7 (L) 09/27/2017   MCV 95.3 09/27/2017   PLT 136 (L) 09/27/2017   No flowsheet data found.  Discharge instruction: per After Visit Summary and "Baby and Me Booklet".  After visit meds:  Allergies as of 09/28/2017      Reactions   Gluten Meal    Sulfa Antibiotics Hives      Medication List    STOP taking these medications   IRON PO     TAKE these medications   ibuprofen 600 MG tablet Commonly known as:  ADVIL,MOTRIN Take 1 tablet (600 mg total) by mouth every 6 (six) hours as needed for mild pain or moderate pain.   loratadine 10 MG tablet Commonly  known as:  CLARITIN Take 10 mg by mouth daily as needed for allergies.   prenatal vitamin w/FE, FA 27-1 MG Tabs tablet Take 1 tablet by mouth daily at 12 noon.       Diet: routine diet  Activity: Advance as tolerated. Pelvic rest for 6 weeks.   Outpatient follow up:6 weeks Follow up Appt:No future appointments. Follow up Visit:No follow-ups on file.  Postpartum contraception: Undecided  Newborn Data: Live born female  Birth Weight: 8 lb 1.1 oz (3660 g) APGAR: 5, 6  Newborn Delivery   Birth date/time:  09/26/2017 01:07:00 Delivery type:  Vaginal, Spontaneous     Baby Feeding: Bottle and Breast Disposition:home with mother vs. Rooming in depending on newborn bilirubin results in the morning    09/28/2017 Janeece RiggersEllis K Mes[place] Janus, CNM

## 2017-09-29 ENCOUNTER — Ambulatory Visit: Payer: Self-pay

## 2017-09-29 NOTE — Lactation Note (Signed)
This note was copied from a baby's chart. Lactation Consultation Note  Patient Name: Valerie Huffman   Mom says she is pumping q3hrs & gets 45-50 mL w/pumping session.LPI info was reviewed (bassinet card provided). Parents had not been aware to limit feedings to 30 minutes. Parents had been keeping baby at breast until she was done "pulling" and then offering a bottle.   Mom reports that infant does well at the breast  & there's a "2" for swallows on the LATCH score. However, there has been no weight gain over the last 24 hours & only a gain of 10g over the immediate preceding 24 hours and bilirubin has increased. Dad mentioned that over the last few hours, infant has been cluster-feeding. In light of the above information, I talked to parents about just using a bottle every other feeding to ensure adequate intake & then offering breast/bottle at the next feeding. Mom agreed.   Valerie Huffman, Valerie Huffman, Valerie Huffman

## 2017-10-01 ENCOUNTER — Ambulatory Visit: Payer: Self-pay

## 2017-10-01 NOTE — Lactation Note (Signed)
This note was copied from a baby's chart. Lactation Consultation Note  Patient Name: Valerie Huffman ZOXWR'UToday's Date: 10/01/2017  P1, LPTI, 5 day old female infant. LC entered room mom was sleeping. Infant under Billi lights. Per dad, Mom is pumping Q3 hrs and milk volume is at 55 mls. now Per dad,  infant is drinking 55 ml EBM now per feeding.  Family is aware of LPTI feeding guidelines for supplementation w/ EBM per age/hrs.  Mom has LC number to call prn.   Maternal Data    Feeding    LATCH Score                   Interventions    Lactation Tools Discussed/Used     Consult Status      Danelle EarthlyRobin Ayub Kirsh 10/01/2017, 4:23 AM

## 2019-02-15 ENCOUNTER — Other Ambulatory Visit: Payer: Self-pay

## 2019-02-15 ENCOUNTER — Encounter: Payer: Self-pay | Admitting: Allergy and Immunology

## 2019-02-15 ENCOUNTER — Telehealth: Payer: Self-pay | Admitting: *Deleted

## 2019-02-15 ENCOUNTER — Ambulatory Visit (INDEPENDENT_AMBULATORY_CARE_PROVIDER_SITE_OTHER): Payer: 59 | Admitting: Allergy and Immunology

## 2019-02-15 VITALS — BP 106/68 | HR 90 | Temp 97.2°F | Resp 18 | Ht 66.5 in | Wt 137.1 lb

## 2019-02-15 DIAGNOSIS — J3089 Other allergic rhinitis: Secondary | ICD-10-CM | POA: Diagnosis not present

## 2019-02-15 DIAGNOSIS — R0609 Other forms of dyspnea: Secondary | ICD-10-CM | POA: Diagnosis not present

## 2019-02-15 DIAGNOSIS — H1010 Acute atopic conjunctivitis, unspecified eye: Secondary | ICD-10-CM | POA: Insufficient documentation

## 2019-02-15 DIAGNOSIS — H1013 Acute atopic conjunctivitis, bilateral: Secondary | ICD-10-CM

## 2019-02-15 MED ORDER — PATADAY 0.1 % OP SOLN: 1.0000 [drp] | Freq: Every day | OPHTHALMIC | 2 refills | Status: AC | PRN

## 2019-02-15 MED ORDER — AZELASTINE-FLUTICASONE 137-50 MCG/ACT NA SUSP: 1.0000 | Freq: Two times a day (BID) | NASAL | 2 refills | Status: AC | PRN

## 2019-02-15 MED ORDER — CARBINOXAMINE MALEATE 4 MG PO TABS: 4.0000 mg | ORAL_TABLET | Freq: Four times a day (QID) | ORAL | 2 refills | Status: AC

## 2019-02-15 NOTE — Patient Instructions (Addendum)
Seasonal and perennial allergic rhinitis  Records have been requested from the patient's previous allergist.  Aeroallergen avoidance measures have been discussed and provided in written form.  A prescription has been provided for carbinoxamine 4 mg every 6-8 hours as needed.  To avoid diminishing benefit with daily use (tachyphylaxis) of second generation antihistamine, consider alternating every few months between fexofenadine (Allegra) and levocetirizine (Xyzal).  A prescription has been provided for azelastine/fluticasone nasal spray, 1-2 sprays per nostril 2 times daily as needed. Proper nasal spray technique has been discussed and demonstrated.   Nasal saline lavage (NeilMed) has been recommended as needed and prior to medicated nasal sprays along with instructions for proper administration.  If allergen avoidance measures and medications fail to adequately relieve symptoms, aeroallergen immunotherapy will be considered.  Allergic conjunctivitis  Treatment plan as outlined above for allergic rhinitis.  A prescription has been provided for Pataday, one drop per eye daily as needed.  I have also recommended eye lubricant drops (i.e., Natural Tears) as needed.  Other forms of dyspnea The patients sensation of air-hunger, not being able to get a full breath on inspiration, which may eventually be relieved by a yawn suggests sighing dyspnea. Other less likely etiologies include vocal cord dysfunction and asthma. The patient reports normal spirometry in the past.  Diaphragmatic breathing, or belly breathing, has been discussed with the patient as this technique often times relieves sighing dyspnea.  If this problem persists or progresses, we will evaluate further.   Return in about 3 months (around 05/16/2019), or if symptoms worsen or fail to improve.  Control of Dust Mite Allergen  House dust mites play a major role in allergic asthma and rhinitis.  They occur in environments  with high humidity wherever human skin, the food for dust mites is found. High levels have been detected in dust obtained from mattresses, pillows, carpets, upholstered furniture, bed covers, clothes and soft toys.  The principal allergen of the house dust mite is found in its feces.  A gram of dust may contain 1,000 mites and 250,000 fecal particles.  Mite antigen is easily measured in the air during house cleaning activities.    1. Encase mattresses, including the box spring, and pillow, in an air tight cover.  Seal the zipper end of the encased mattresses with wide adhesive tape. 2. Wash the bedding in water of 130 degrees Farenheit weekly.  Avoid cotton comforters/quilts and flannel bedding: the most ideal bed covering is the dacron comforter. 3. Remove all upholstered furniture from the bedroom. 4. Remove carpets, carpet padding, rugs, and non-washable window drapes from the bedroom.  Wash drapes weekly or use plastic window coverings. 5. Remove all non-washable stuffed toys from the bedroom.  Wash stuffed toys weekly. 6. Have the room cleaned frequently with a vacuum cleaner and a damp dust-mop.  The patient should not be in a room which is being cleaned and should wait 1 hour after cleaning before going into the room. 7. Close and seal all heating outlets in the bedroom.  Otherwise, the room will become filled with dust-laden air.  An electric heater can be used to heat the room. Reduce indoor humidity to less than 50%.  Do not use a humidifier.   Reducing Pollen Exposure  The American Academy of Allergy, Asthma and Immunology suggests the following steps to reduce your exposure to pollen during allergy seasons.    1. Do not hang sheets or clothing out to dry; pollen may collect on these items. 2.  Do not mow lawns or spend time around freshly cut grass; mowing stirs up pollen. 3. Keep windows closed at night.  Keep car windows closed while driving. 4. Minimize morning activities outdoors,  a time when pollen counts are usually at their highest. 5. Stay indoors as much as possible when pollen counts or humidity is high and on windy days when pollen tends to remain in the air longer. 6. Use air conditioning when possible.  Many air conditioners have filters that trap the pollen spores. 7. Use a HEPA room air filter to remove pollen form the indoor air you breathe.   Control of Dog or Cat Allergen  Avoidance is the best way to manage a dog or cat allergy. If you have a dog or cat and are allergic to dog or cats, consider removing the dog or cat from the home. If you have a dog or cat but don't want to find it a new home, or if your family wants a pet even though someone in the household is allergic, here are some strategies that may help keep symptoms at bay:  1. Keep the pet out of your bedroom and restrict it to only a few rooms. Be advised that keeping the dog or cat in only one room will not limit the allergens to that room. 2. Don't pet, hug or kiss the dog or cat; if you do, wash your hands with soap and water. 3. High-efficiency particulate air (HEPA) cleaners run continuously in a bedroom or living room can reduce allergen levels over time. 4. Place electrostatic material sheet in the air inlet vent in the bedroom. 5. Regular use of a high-efficiency vacuum cleaner or a central vacuum can reduce allergen levels. 6. Giving your dog or cat a bath at least once a week can reduce airborne allergen.   Control of Mold Allergen  Mold and fungi can grow on a variety of surfaces provided certain temperature and moisture conditions exist.  Outdoor molds grow on plants, decaying vegetation and soil.  The major outdoor mold, Alternaria and Cladosporium, are found in very high numbers during hot and dry conditions.  Generally, a late Summer - Fall peak is seen for common outdoor fungal spores.  Rain will temporarily lower outdoor mold spore count, but counts rise rapidly when the rainy  period ends.  The most important indoor molds are Aspergillus and Penicillium.  Dark, humid and poorly ventilated basements are ideal sites for mold growth.  The next most common sites of mold growth are the bathroom and the kitchen.  Outdoor [employer] 1. Use air conditioning and keep windows closed 2. Avoid exposure to decaying vegetation. 3. Avoid leaf raking. 4. Avoid grain handling. 5. Consider wearing a face mask if working in moldy areas.  Indoor Mold Control 1. Maintain humidity below 50%. 2. Clean washable surfaces with 5% bleach solution. 3. Remove sources e.g. Contaminated carpets.

## 2019-02-15 NOTE — Progress Notes (Signed)
New Patient Note  RE: Valerie Huffman MRN: [id number] DOB: 06-Dec-1985 Date of Office Visit: 02/15/2019  Referring provider: No ref. provider found Primary care provider: Patient, No Pcp Per  Chief Complaint: Nasal Congestion, Sore Throat, and Sinusitis  History of present illness: Valerie Huffman is a 33 y.o. female presenting today for evaluation of rhinitis and pruritus. She complains of "constant sniffing", postnasal drainage, irritated throat, pharyngeal pruritus, frontal and maxillary sinus pressure, rings under the eyes, and nasal congestion. These symptoms occur year-round. In the past, these symptoms have been more prominent during the fall, however this year the symptoms have persisted beyond the fall season.  She had been skin tested in 2013 with reported reactivity to tree pollen, cat hair, and dust mite antigen.  She had been on immunotherapy injections from 2013-2016 and discontinued at that time when she moved from [place] to [place]. She reports that her allergy symptoms have been well controlled while on the immunotherapy injections, however approximately 2 to 3 years after discontinuing immunotherapy injections the symptoms began to return. Valerie Huffman reports that for many years she has experienced intense pruritus on the palms of her hands and the soles of her feet. As result, she has taken cetirizine 10 mg daily for the past 10 years. She states that she is unable to discontinue the cetirizine because of the intense itching. Approximately 2 years ago she developed generalized urticaria. She was evaluated and diagnosed with viral induced urticaria. The urticaria resolved approximately 2 days after starting an H1/H2 receptor blockade. The hives have not returned since that time. Valerie Huffman states that she occasionally feels "winded", particularly when her nasal/ocular allergy symptoms are active. She describes having difficulty getting a full breath in. On occasion, a deep beyond is able to  provide temporary relief. She denies wheezing and reports that previous evaluation with spirometry ruled out asthma.  Assessment and plan: Seasonal and perennial allergic rhinitis  Records have been requested from the patient's previous allergist.  Aeroallergen avoidance measures have been discussed and provided in written form.  A prescription has been provided for carbinoxamine 4 mg every 6-8 hours as needed.  To avoid diminishing benefit with daily use (tachyphylaxis) of second generation antihistamine, consider alternating every few months between fexofenadine (Allegra) and levocetirizine (Xyzal).  A prescription has been provided for azelastine/fluticasone nasal spray, 1-2 sprays per nostril 2 times daily as needed. Proper nasal spray technique has been discussed and demonstrated.   Nasal saline lavage (NeilMed) has been recommended as needed and prior to medicated nasal sprays along with instructions for proper administration.  If allergen avoidance measures and medications fail to adequately relieve symptoms, aeroallergen immunotherapy will be considered.  Allergic conjunctivitis  Treatment plan as outlined above for allergic rhinitis.  A prescription has been provided for Pataday, one drop per eye daily as needed.  I have also recommended eye lubricant drops (i.e., Natural Tears) as needed.  Other forms of dyspnea The patients sensation of air-hunger, not being able to get a full breath on inspiration, which may eventually be relieved by a yawn suggests sighing dyspnea. Other less likely etiologies include vocal cord dysfunction and asthma. The patient reports normal spirometry in the past.  Diaphragmatic breathing, or belly breathing, has been discussed with the patient as this technique often times relieves sighing dyspnea.  If this problem persists or progresses, we will evaluate further.   Meds ordered this encounter  Medications  . Carbinoxamine Maleate 4 MG TABS     Sig: Take  1 tablet (4 mg total) by mouth every 6 (six) hours.    Dispense:  60 tablet    Refill:  2  . Azelastine-Fluticasone 137-50 MCG/ACT SUSP    Sig: Place 1-2 sprays into the nose 2 (two) times daily as needed.    Dispense:  23 g    Refill:  2  . PATADAY 0.1 % ophthalmic solution    Sig: Place 1 drop into both eyes daily as needed for allergies.    Dispense:  5 mL    Refill:  2    Diagnostics: Epicutaneous testing: Positive to weed pollen and ragweed pollen. Intradermal testing: Positive to grass pollen, tree pollen, molds, cat hair, and dust mite antigen.   Physical examination: Blood pressure 106/68, pulse 90, temperature (!) 97.2 F (36.2 C), temperature source Temporal, resp. rate 18, height 5' 6.5" (1.689 m), weight 137 lb 1.9 oz (62.2 kg), SpO2 97 %, unknown if currently breastfeeding.  General: Alert, interactive, in no acute distress. HEENT: TMs pearly gray, turbinates moderately edematous with clear discharge, post-pharynx mildly erythematous. Neck: Supple without lymphadenopathy. Lungs: Clear to auscultation without wheezing, rhonchi or rales. CV: Normal S1, S2 without murmurs. Abdomen: Nondistended, nontender. Skin: Warm and dry, without lesions or rashes. Extremities:  No clubbing, cyanosis or edema. Neuro:   Grossly intact.  Review of systems:  Review of systems negative except as noted in HPI / PMHx or noted below: Review of Systems  Constitutional: Negative.   HENT: Negative.   Eyes: Negative.   Respiratory: Negative.   Cardiovascular: Negative.   Gastrointestinal: Negative.   Genitourinary: Negative.   Musculoskeletal: Negative.   Skin: Negative.   Neurological: Negative.   Endo/Heme/Allergies: Negative.   Psychiatric/Behavioral: Negative.     Past medical history:  Past Medical History:  Diagnosis Date  . Complication of anesthesia    pulse 140 during surgery   . Medical history non-contributory   . Urticaria     Past surgical history:   Past Surgical History:  Procedure Laterality Date  . ankle      Family history: Family History  Problem Relation Age of Onset  . Diabetes Father   . Allergic rhinitis Mother     Social history: Social History   Socioeconomic History  . Marital status: Married    Spouse name: Not on file  . Number of children: Not on file  . Years of education: Not on file  . Highest education level: Not on file  Occupational History  . Not on file  Tobacco Use  . Smoking status: Never Smoker  . Smokeless tobacco: Never Used  Substance and Sexual Activity  . Alcohol use: Never  . Drug use: Never  . Sexual activity: Yes  Other Topics Concern  . Not on file  Social History Narrative  . Not on file   Social Determinants of Health   Financial Resource Strain:   . Difficulty of Paying Living Expenses: Not on file  Food Insecurity:   . Worried About Charity fundraiser in the Last Year: Not on file  . Ran Out of Food in the Last Year: Not on file  Transportation Needs:   . Lack of Transportation (Medical): Not on file  . Lack of Transportation (Non-Medical): Not on file  Physical Activity:   . Days of Exercise per Week: Not on file  . Minutes of Exercise per Session: Not on file  Stress:   . Feeling of Stress : Not on file  Social Connections:   .  Frequency of Communication with Friends and Family: Not on file  . Frequency of Social Gatherings with Friends and Family: Not on file  . Attends Religious Services: Not on file  . Active Member of Clubs or Organizations: Not on file  . Attends BankerClub or Organization Meetings: Not on file  . Marital Status: Not on file  Intimate Partner Violence:   . Fear of Current or Ex-Partner: Not on file  . Emotionally Abused: Not on file  . Physically Abused: Not on file  . Sexually Abused: Not on file    Environmental History: The patient lives in a 31 year old townhouse with carpeting throughout, gassy, and central air.  There is no known  mold/water damage in the home.  There are no pets in the home.  She is a non-smoker.  Current Outpatient Medications  Medication Sig Dispense Refill  . cetirizine (ZYRTEC) 10 MG tablet Take 10 mg by mouth daily.    . cholecalciferol (VITAMIN D3) 25 MCG (1000 UT) tablet Take 3,000 Units by mouth daily.    Marland Kitchen. ibuprofen (ADVIL,MOTRIN) 600 MG tablet Take 1 tablet (600 mg total) by mouth every 6 (six) hours as needed for mild pain or moderate pain. 30 tablet 0  . MELATONIN GUMMIES PO Take 1 mg by mouth daily.    . prenatal vitamin w/FE, FA (PRENATAL 1 + 1) 27-1 MG TABS tablet Take 1 tablet by mouth daily at 12 noon.    . Azelastine-Fluticasone 137-50 MCG/ACT SUSP Place 1-2 sprays into the nose 2 (two) times daily as needed. 23 g 2  . Carbinoxamine Maleate 4 MG TABS Take 1 tablet (4 mg total) by mouth every 6 (six) hours. 60 tablet 2  . PATADAY 0.1 % ophthalmic solution Place 1 drop into both eyes daily as needed for allergies. 5 mL 2   No current facility-administered medications for this visit.    Known medication allergies: Allergies  Allergen Reactions  . Gluten Meal   . Sulfa Antibiotics Hives    I appreciate the opportunity to take part in Valerie Huffman's care. Please do not hesitate to contact me with questions.  Sincerely,   R. Jorene Guestarter Analee Montee, MD

## 2019-02-15 NOTE — Assessment & Plan Note (Signed)
The patients sensation of air-hunger, not being able to get a full breath on inspiration, which may eventually be relieved by a yawn suggests sighing dyspnea. Other less likely etiologies include vocal cord dysfunction and asthma. The patient reports normal spirometry in the past.  Diaphragmatic breathing, or belly breathing, has been discussed with the patient as this technique often times relieves sighing dyspnea.  If this problem persists or progresses, we will evaluate further.

## 2019-02-15 NOTE — Telephone Encounter (Signed)
Medical Records Request form has been faxed to [facility] in [place], [place] requesting all medical records.

## 2019-02-15 NOTE — Assessment & Plan Note (Addendum)
   Records have been requested from the patient's previous allergist.  Aeroallergen avoidance measures have been discussed and provided in written form.  A prescription has been provided for carbinoxamine 4 mg every 6-8 hours as needed.  To avoid diminishing benefit with daily use (tachyphylaxis) of second generation antihistamine, consider alternating every few months between fexofenadine (Allegra) and levocetirizine (Xyzal).  A prescription has been provided for azelastine/fluticasone nasal spray, 1-2 sprays per nostril 2 times daily as needed. Proper nasal spray technique has been discussed and demonstrated.   Nasal saline lavage (NeilMed) has been recommended as needed and prior to medicated nasal sprays along with instructions for proper administration.  If allergen avoidance measures and medications fail to adequately relieve symptoms, aeroallergen immunotherapy will be considered.

## 2019-02-15 NOTE — Assessment & Plan Note (Signed)
   Treatment plan as outlined above for allergic rhinitis.  A prescription has been provided for Pataday, one drop per eye daily as needed.  I have also recommended eye lubricant drops (i.e., Natural Tears) as needed. 

## 2019-02-17 ENCOUNTER — Other Ambulatory Visit: Payer: Self-pay | Admitting: *Deleted

## 2019-02-22 NOTE — Telephone Encounter (Signed)
Faxed form again and also included [facility] fax number for them to fax the records to our [facility] office.

## 2019-03-03 NOTE — Telephone Encounter (Signed)
Records have been received and placed on Dr. Sheran Fava desk in [place] for review on Monday.

## 2019-05-24 ENCOUNTER — Ambulatory Visit: Payer: 59 | Admitting: Allergy and Immunology

## 2019-08-17 IMAGING — US US MFM OB LIMITED
1 series · 15 of 20 positions shown · non-contrast
Comparison: none

[Series 1: us mfm ob limited · 15 of 20 slices shown]
[im 1/20]
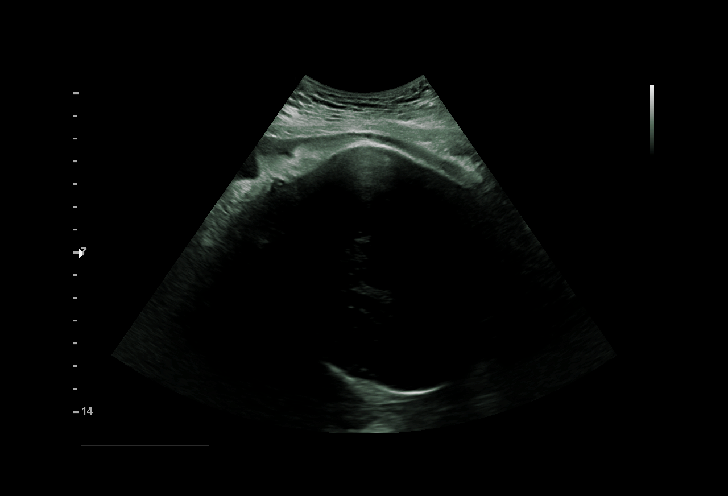
[im 3/20]
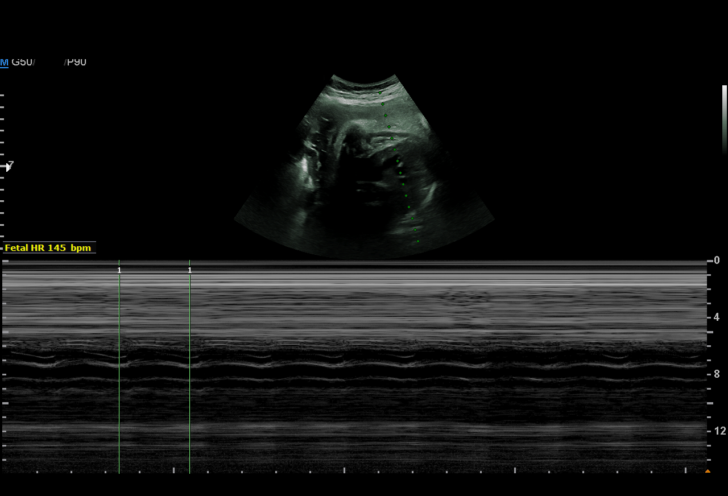
[im 4/20]
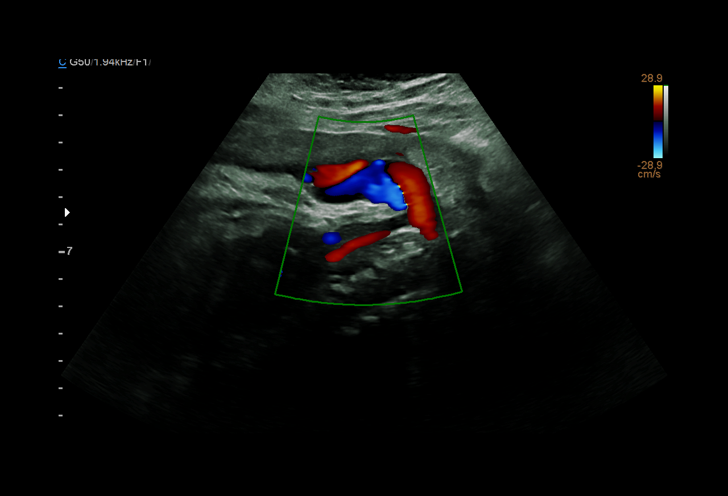
[im 5/20]
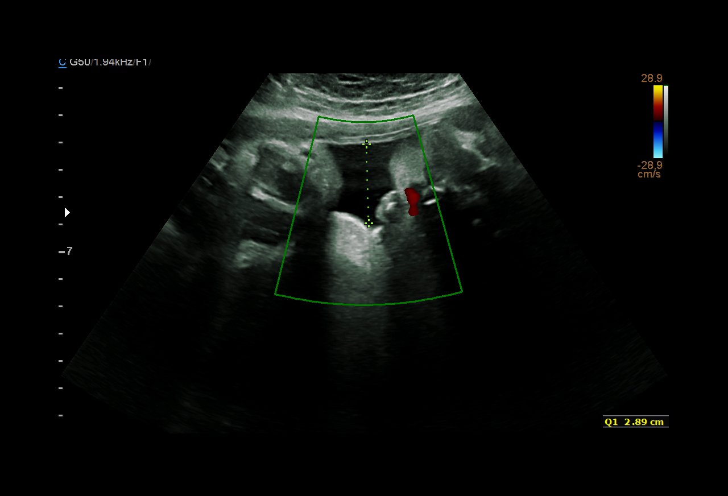
[im 7/20]
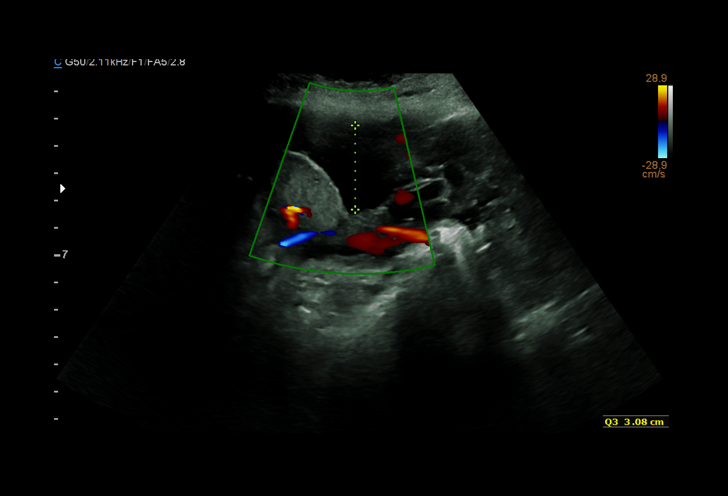
[im 8/20]
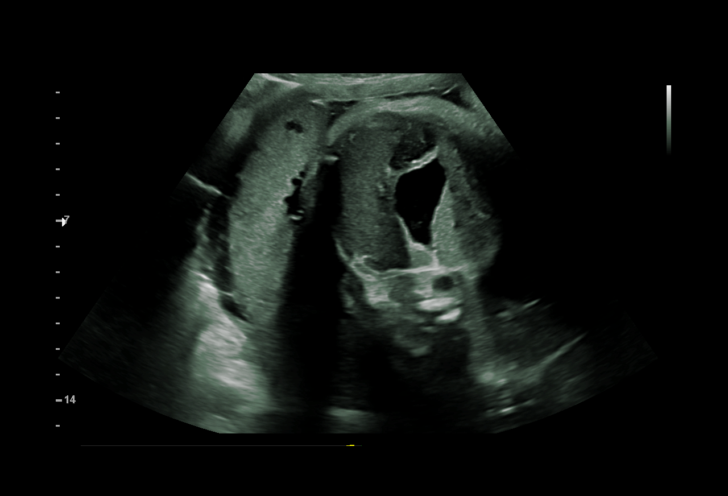
[im 9/20]
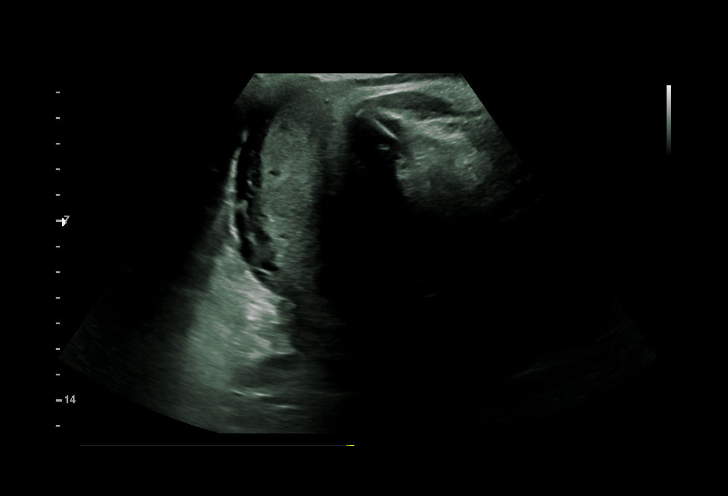
[im 11/20]
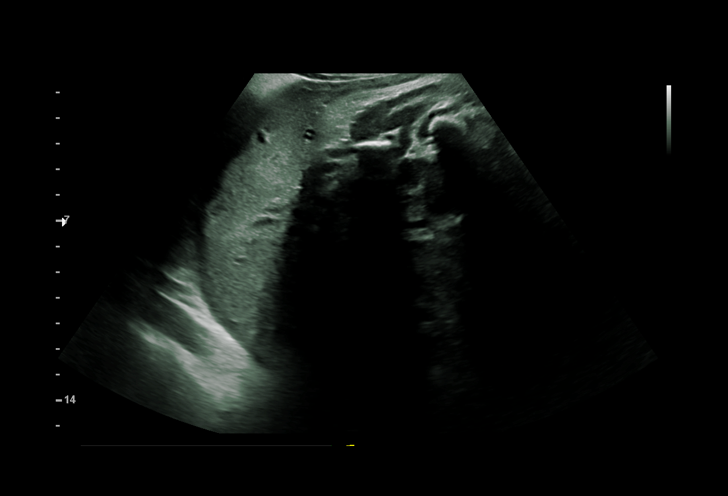
[im 12/20]
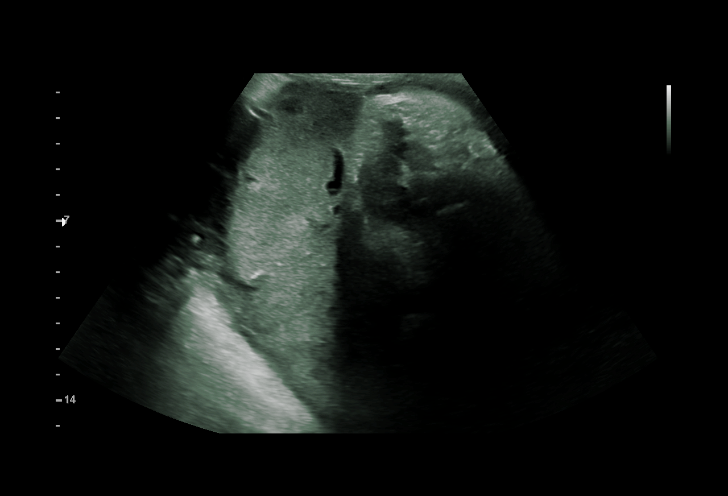
[im 13/20]
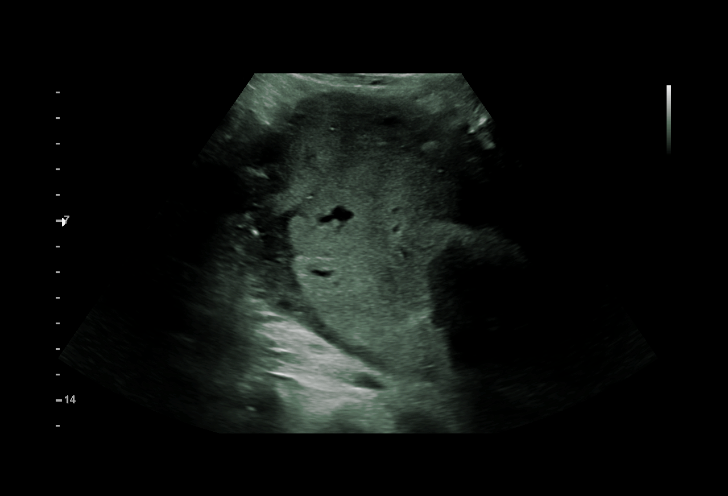
[im 15/20]
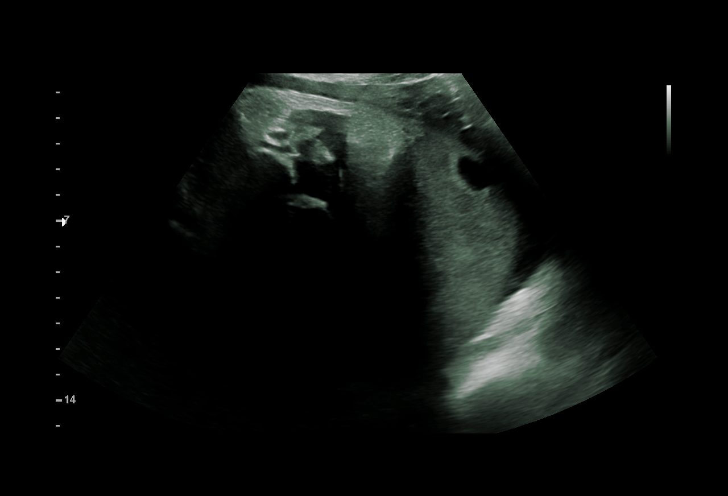
[im 16/20]
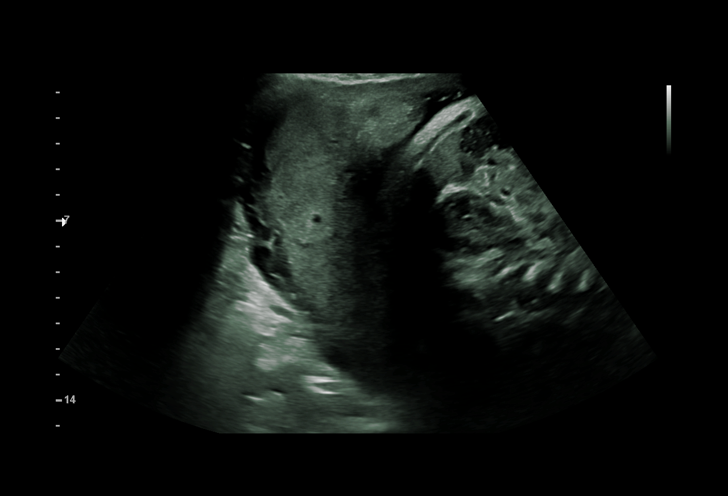
[im 17/20]
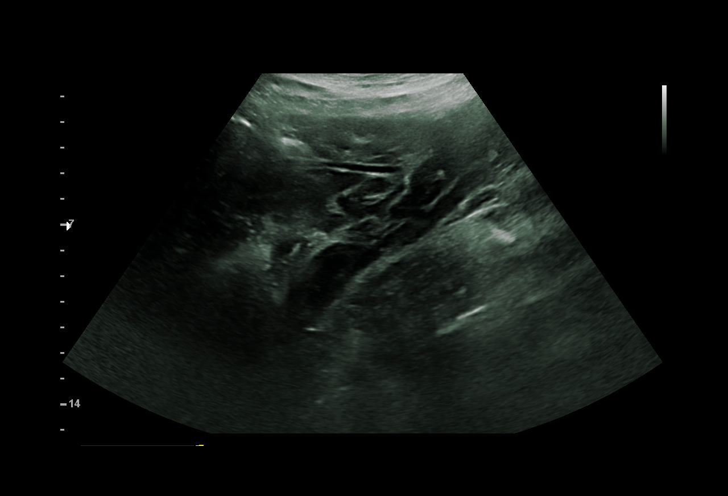
[im 19/20]
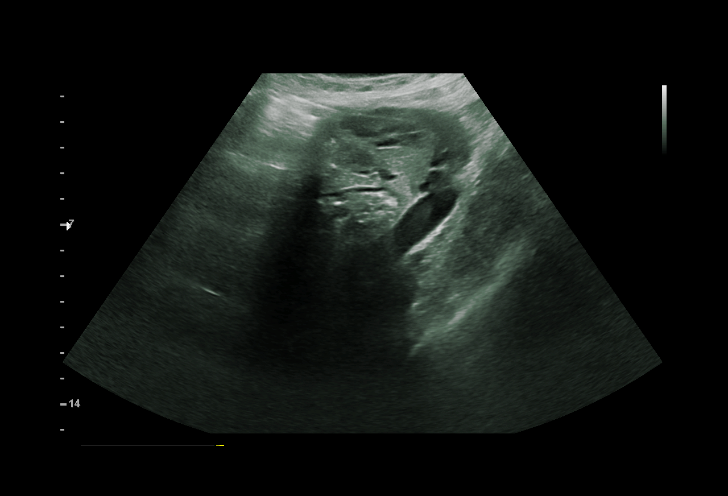
[im 20/20]
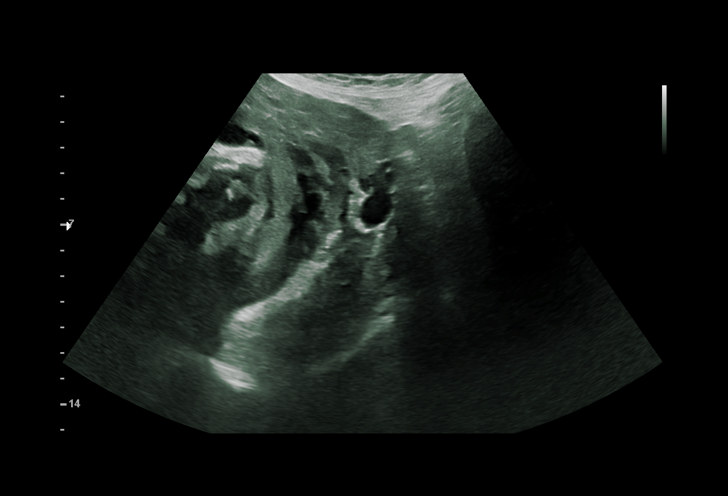

[15 of 20 positions shown; findings below may reference images not displayed]

Obstetrics &
Gynecology
[address]
[place].
MAU/Triage

1  KALYN SAVINO              [id number]      [id number]     [id number]
Indications

36 weeks gestation of pregnancy
Premature rupture of membranes - leaking
fluid
Determine Fetal presentation by ultrasound
OB History

Gravidity:    1
Fetal Evaluation

Num Of Fetuses:     1
Fetal Heart         145
Rate(bpm):
Cardiac Activity:   Observed
Presentation:       Cephalic
Placenta:           Posterior Fundal

Amniotic Fluid
AFI FV:      Subjectively decreased

AFI Sum(cm)     %Tile       Largest Pocket(cm)
7.24            4

RUQ(cm)                     LUQ(cm)        LLQ(cm)
2.89
Gestational Age

Clinical EDD:  36w 5d                                        EDD:   10/18/17
Best:          36w 5d     Det. By:  Clinical EDD             EDD:   10/18/17
Cervix Uterus Adnexa

Cervix
Not visualized (advanced GA >43wks)

Left Ovary
Not visualized.

Right Ovary
Not visualized.
Impression

Patient was evaluated in the [facility] for c/o leakage of amniotic
fluid.

A limited ultrasound study was performed. Amniotic fluid is
decreased for this gestational age (no oligohydramnios).
Cephalic presentation.
Recommendations

Clinical correlation is recommended.

## 2020-03-01 ENCOUNTER — Other Ambulatory Visit: Payer: Self-pay

## 2020-03-01 ENCOUNTER — Ambulatory Visit (HOSPITAL_COMMUNITY)
Admission: EM | Admit: 2020-03-01 | Discharge: 2020-03-01 | Disposition: A | Discharge status: Home / Self Care | Payer: 59 | Attending: Urgent Care | Admitting: Urgent Care

## 2020-03-01 ENCOUNTER — Encounter (HOSPITAL_COMMUNITY): Payer: Self-pay

## 2020-03-01 DIAGNOSIS — R070 Pain in throat: Secondary | ICD-10-CM | POA: Diagnosis present

## 2020-03-01 DIAGNOSIS — R0981 Nasal congestion: Secondary | ICD-10-CM | POA: Insufficient documentation

## 2020-03-01 DIAGNOSIS — J019 Acute sinusitis, unspecified: Secondary | ICD-10-CM | POA: Diagnosis present

## 2020-03-01 DIAGNOSIS — J3089 Other allergic rhinitis: Secondary | ICD-10-CM

## 2020-03-01 DIAGNOSIS — Z20822 Contact with and (suspected) exposure to covid-19: Secondary | ICD-10-CM | POA: Insufficient documentation

## 2020-03-01 DIAGNOSIS — R059 Cough, unspecified: Secondary | ICD-10-CM | POA: Diagnosis present

## 2020-03-01 LAB — SARS CORONAVIRUS 2 (TAT 6-24 HRS): SARS Coronavirus 2: NEGATIVE

## 2020-03-01 MED ORDER — AMOXICILLIN-POT CLAVULANATE 875-125 MG PO TABS: 1.0000 | ORAL_TABLET | Freq: Two times a day (BID) | ORAL | 0 refills | Status: AC

## 2020-03-01 MED ORDER — PSEUDOEPHEDRINE HCL 60 MG PO TABS: 60.0000 mg | ORAL_TABLET | Freq: Three times a day (TID) | ORAL | 0 refills | Status: AC | PRN

## 2020-03-01 NOTE — ED Triage Notes (Signed)
Pt presents with a sore throat x 2 weeks. Pt states she has had a dry cough and fatigue x 2 weeks. She states she has taken Claritin. Pt denies fever, SOB and chest pain.

## 2020-03-01 NOTE — ED Provider Notes (Signed)
[facility]   MRN: [id number] DOB: 1985/09/03  Subjective:   Valerie Huffman is a 35 y.o. female presenting for 2-week history of persistent dry throat.  Patient has also had sinus congestion, coughing and fatigue.  She takes Claritin daily for her allergies.  She has had 2 negative COVID tests but not for this particular illness.  She is vaccinated and has a booster shot for COVID.  Denies fever, chest pain, shortness of breath.  Patient is not a smoker.  No current facility-administered medications for this encounter.  Current Outpatient Medications:  .  Azelastine-Fluticasone 137-50 MCG/ACT SUSP, Place 1-2 sprays into the nose 2 (two) times daily as needed., Disp: 23 g, Rfl: 2 .  Carbinoxamine Maleate 4 MG TABS, Take 1 tablet (4 mg total) by mouth every 6 (six) hours., Disp: 60 tablet, Rfl: 2 .  cetirizine (ZYRTEC) 10 MG tablet, Take 10 mg by mouth daily., Disp: , Rfl:  .  cholecalciferol (VITAMIN D3) 25 MCG (1000 UT) tablet, Take 3,000 Units by mouth daily., Disp: , Rfl:  .  ibuprofen (ADVIL,MOTRIN) 600 MG tablet, Take 1 tablet (600 mg total) by mouth every 6 (six) hours as needed for mild pain or moderate pain., Disp: 30 tablet, Rfl: 0 .  MELATONIN GUMMIES PO, Take 1 mg by mouth daily., Disp: , Rfl:  .  PATADAY 0.1 % ophthalmic solution, Place 1 drop into both eyes daily as needed for allergies., Disp: 5 mL, Rfl: 2 .  prenatal vitamin w/FE, FA (PRENATAL 1 + 1) 27-1 MG TABS tablet, Take 1 tablet by mouth daily at 12 noon., Disp: , Rfl:    Allergies  Allergen Reactions  . Gluten Meal   . Sulfa Antibiotics Hives    Past Medical History:  Diagnosis Date  . Complication of anesthesia    pulse 140 during surgery   . Medical history non-contributory   . Urticaria      Past Surgical History:  Procedure Laterality Date  . ankle      Family History  Problem Relation Age of Onset  . Diabetes Father   . Allergic rhinitis Mother     Social History   Tobacco  Use  . Smoking status: Never Smoker  . Smokeless tobacco: Never Used  Vaping Use  . Vaping Use: Never used  Substance Use Topics  . Alcohol use: Never  . Drug use: Never    ROS   Objective:   Vitals: BP 117/80 (BP Location: Left Arm)   Pulse 74   Temp 98.2 F (36.8 C) (Oral)   Resp 18   LMP 02/13/2020 (Approximate)   SpO2 100%   Physical Exam Constitutional:      General: She is not in acute distress.    Appearance: Normal appearance. She is well-developed. She is not ill-appearing, toxic-appearing or diaphoretic.  HENT:     Head: Normocephalic and atraumatic.     Right Ear: Tympanic membrane and ear canal normal. No drainage or tenderness. No middle ear effusion. Tympanic membrane is not erythematous.     Left Ear: Tympanic membrane and ear canal normal. No drainage or tenderness.  No middle ear effusion. Tympanic membrane is not erythematous.     Nose: Nose normal. No congestion or rhinorrhea.     Mouth/Throat:     Mouth: Mucous membranes are moist. No oral lesions.     Pharynx: No pharyngeal swelling, oropharyngeal exudate, posterior oropharyngeal erythema or uvula swelling.     Tonsils: No tonsillar  exudate or tonsillar abscesses.     Comments: Significant postnasal drainage overlying pharynx. Eyes:     General: No scleral icterus.    Extraocular Movements: Extraocular movements intact.     Right eye: Normal extraocular motion.     Left eye: Normal extraocular motion.     Conjunctiva/sclera: Conjunctivae normal.     Pupils: Pupils are equal, round, and reactive to light.  Cardiovascular:     Rate and Rhythm: Normal rate and regular rhythm.     Pulses: Normal pulses.     Heart sounds: Normal heart sounds. No murmur heard. No friction rub. No gallop.   Pulmonary:     Effort: Pulmonary effort is normal. No respiratory distress.     Breath sounds: Normal breath sounds. No stridor. No wheezing, rhonchi or rales.  Musculoskeletal:     Cervical back: Normal range of  motion and neck supple.  Lymphadenopathy:     Cervical: No cervical adenopathy.  Skin:    General: Skin is warm and dry.     Findings: No rash.  Neurological:     General: No focal deficit present.     Mental Status: She is alert and oriented to person, place, and time.  Psychiatric:        Mood and Affect: Mood normal.        Behavior: Behavior normal.        Thought Content: Thought content normal.      Assessment and Plan :   PDMP not reviewed this encounter.  1. Acute non-recurrent sinusitis, unspecified location   2. Throat pain   3. Allergic rhinitis due to other allergic trigger, unspecified seasonality   4. Sinus congestion   5. Cough     Will start empiric treatment for sinusitis secondary to allergic rhinitis versus initial viral URI with Augmentin.  Recommended supportive care otherwise including the continued use of oral antihistamine, decongestant.  COVID-19 testing pending.  Counseled patient on potential for adverse effects with medications prescribed/recommended today, ER and return-to-clinic precautions discussed, patient verbalized understanding.    Wallis Bamberg, PA-C 03/01/20 1419

## 2020-03-08 ENCOUNTER — Telehealth: Payer: 59 | Admitting: Family

## 2020-03-08 DIAGNOSIS — J029 Acute pharyngitis, unspecified: Secondary | ICD-10-CM

## 2020-03-08 NOTE — Progress Notes (Signed)
Based on what you shared with me, I feel your condition warrants further evaluation and I recommend that you be seen for a face to face visit.  Please contact your primary care physician practice to be seen. Many offices offer virtual options to be seen via video if you are not comfortable going in person to a medical facility at this time.  If you do not have a PCP, [facility] offers a free physician referral service available at [phone]. Our trained staff has the experience, knowledge and resources to put you in touch with a physician who is right for you.   You also have the option of a video visit through [email or web address]  If you are having a true medical emergency please call 911.  NOTE: If you entered your credit card information for this eVisit, you will not be charged. You may see a "hold" on your card for the $35 but that hold will drop off and you will not have a charge processed.  Your e-visit answers were reviewed by a board certified advanced clinical practitioner to complete your personal care plan.  Thank you for using e-Visits.  Greater than 5 minutes, yet less than 10 minutes of time have been spent researching, coordinating, and implementing care for this patient.
